# Patient Record
Sex: Female | Born: 1990 | Race: White | Hispanic: No | Marital: Married | Smoking: Never smoker
Health system: Southern US, Community
[De-identification: ages and names within clinical notes are randomized; demographics above are authoritative.]

## PROBLEM LIST (undated history)

## (undated) DIAGNOSIS — Z789 Other specified health status: Secondary | ICD-10-CM

## (undated) DIAGNOSIS — O24419 Gestational diabetes mellitus in pregnancy, unspecified control: Secondary | ICD-10-CM

## (undated) HISTORY — DX: Gestational diabetes mellitus in pregnancy, unspecified control: O24.419

---

## 2012-12-29 HISTORY — PX: COLONOSCOPY: SHX174

## 2016-06-27 ENCOUNTER — Other Ambulatory Visit: Payer: Self-pay | Admitting: Obstetrics and Gynecology

## 2016-06-27 DIAGNOSIS — Z369 Encounter for antenatal screening, unspecified: Secondary | ICD-10-CM

## 2016-07-17 ENCOUNTER — Ambulatory Visit
Admission: RE | Admit: 2016-07-17 | Discharge: 2016-07-17 | Disposition: A | Discharge status: Home / Self Care | Payer: PRIVATE HEALTH INSURANCE | Source: Ambulatory Visit | Attending: Maternal & Fetal Medicine | Admitting: Maternal & Fetal Medicine

## 2016-07-17 DIAGNOSIS — O321XX0 Maternal care for breech presentation, not applicable or unspecified: Secondary | ICD-10-CM | POA: Diagnosis not present

## 2016-07-17 DIAGNOSIS — Z3481 Encounter for supervision of other normal pregnancy, first trimester: Secondary | ICD-10-CM | POA: Insufficient documentation

## 2016-07-17 DIAGNOSIS — Z3A12 12 weeks gestation of pregnancy: Secondary | ICD-10-CM | POA: Insufficient documentation

## 2016-07-17 DIAGNOSIS — Z369 Encounter for antenatal screening, unspecified: Secondary | ICD-10-CM

## 2016-07-17 HISTORY — DX: Other specified health status: Z78.9

## 2016-11-14 ENCOUNTER — Encounter: Payer: Self-pay | Admitting: *Deleted

## 2016-11-14 ENCOUNTER — Encounter: Payer: PRIVATE HEALTH INSURANCE | Attending: Obstetrics and Gynecology | Admitting: *Deleted

## 2016-11-14 VITALS — BP 92/60 | Ht 64.0 in | Wt 164.2 lb

## 2016-11-14 DIAGNOSIS — Z3A00 Weeks of gestation of pregnancy not specified: Secondary | ICD-10-CM | POA: Diagnosis not present

## 2016-11-14 DIAGNOSIS — Z713 Dietary counseling and surveillance: Secondary | ICD-10-CM | POA: Insufficient documentation

## 2016-11-14 DIAGNOSIS — O24410 Gestational diabetes mellitus in pregnancy, diet controlled: Secondary | ICD-10-CM | POA: Diagnosis present

## 2016-11-14 NOTE — Progress Notes (Signed)
Diabetes Self-Management Education  Visit Type: First/Initial  Appt. Start Time: 0855 Appt. End Time: 1025  11/14/2016  Ms. Lucky RathkeKelli Facer, identified by name and date of birth, is a 25 y.o. female with a diagnosis of Diabetes: Gestational Diabetes.   ASSESSMENT  Blood pressure 92/60, height 5\' 4"  (1.626 m), weight 164 lb 3.2 oz (74.5 kg), last menstrual period 04/19/2016. Body mass index is 28.18 kg/m.      Diabetes Self-Management Education - 11/14/16 1106      Visit Information   Visit Type First/Initial     Initial Visit   Diabetes Type Gestational Diabetes   Are you currently following a meal plan? Yes   What type of meal plan do you follow? more chicken and green beans   Are you taking your medications as prescribed? Yes  needs to pick up iron from pharmacy   Date Diagnosed last week - 10/2016     Health Coping   How would you rate your overall health? Good     Psychosocial Assessment   Patient Belief/Attitude about Diabetes Motivated to manage diabetes   Self-care barriers None   Self-management support Doctor's office;Family   Patient Concerns Nutrition/Meal planning;Healthy Lifestyle   Special Needs None   Preferred Learning Style Auditory;Hands on   Learning Readiness Change in progress   How often do you need to have someone help you when you read instructions, pamphlets, or other written materials from your doctor or pharmacy? 1 - Never   What is the last grade level you completed in school?  DA degree     Pre-Education Assessment   Patient understands the diabetes disease and treatment process. Needs Instruction   Patient understands incorporating nutritional management into lifestyle. Needs Instruction   Patient undertands incorporating physical activity into lifestyle. Needs Instruction   Patient understands using medications safely. Needs Instruction   Patient understands monitoring blood glucose, interpreting and using results Needs Instruction   Patient understands prevention, detection, and treatment of acute complications. Needs Instruction   Patient understands prevention, detection, and treatment of chronic complications. Needs Instruction   Patient understands how to develop strategies to address psychosocial issues. Needs Instruction   Patient understands how to develop strategies to promote health/change behavior. Needs Instruction     Complications   How often do you check your blood sugar? 0 times/day (not testing)   Provided One Touch Verio Flex meter and instructed on use. BG upon return demonstration was 82 mg/dL at 96:0410:15 am - 2 1/2 hrs pp.    Have you had a dilated eye exam in the past 12 months? No   Have you had a dental exam in the past 12 months? Yes   Are you checking your feet? No     Dietary Intake   Breakfast peanut butter toast and little bit of honey   Snack (morning) [place] yogurt with some granola   Lunch chicken and green beans   Snack (afternoon) cheese stick   Dinner chicken and green beans   Beverage(s) water, 1 cup of coffee per day     Exercise   Exercise Type Light (walking / raking leaves)   How many days per week to you exercise? 5   How many minutes per day do you exercise? 30   Total minutes per week of exercise 150     Patient Education   Previous Diabetes Education No   Disease state  Definition of diabetes, type 1 and 2, and the diagnosis of diabetes  Nutrition management  Role of diet in the treatment of diabetes and the relationship between the three main macronutrients and blood glucose level;Food label reading, portion sizes and measuring food.   Physical activity and exercise  Role of exercise on diabetes management, blood pressure control and cardiac health.   Monitoring Taught/evaluated SMBG meter.;Purpose and frequency of SMBG.;Taught/discussed recording of test results and interpretation of SMBG.;Ketone testing, when, how.   Chronic complications Relationship between chronic  complications and blood glucose control   Psychosocial adjustment Identified and addressed patients feelings and concerns about diabetes   Preconception care Pregnancy and GDM  Role of pre-pregnancy blood glucose control on the development of the fetus;Reviewed with patient blood glucose goals with pregnancy;Role of family planning for patients with diabetes     Individualized Goals (developed by patient)   Reducing Risk Lead a healthier lifestyle     Outcomes   Expected Outcomes Demonstrated interest in learning. Expect positive outcomes   Future DMSE 2 wks      Individualized Plan for Diabetes Self-Management Training:   Learning Objective:  Patient will have a greater understanding of diabetes self-management. Patient education plan is to attend individual and/or group sessions per assessed needs and concerns.   Plan:   Patient Instructions  Read booklet on Gestational Diabetes Follow Gestational Meal Planning Guidelines Complete a 3 Day Food Record and bring to next appointment Check blood sugars 4 x day - before breakfast and 2 hrs after every meal and record  Bring blood sugar log to all appointments Call MD for prescription for meter strips and lancets Strips One Touch Verio  Lancets   One Touch Delica Purchase urine ketone strips if blood sugars not controlled and check urine ketones every am:  If + increase bedtime snack to 1 protein and 2 carbohydrate servings Walk 20-30 minutes at least 5 x week if permitted by MD   Expected Outcomes:  Demonstrated interest in learning. Expect positive outcomes  Education material provided:  Gestational Booklet Gestational Meal Planning Guidelines Viewed Gestational Diabetes Video Meter - One Touch Verio Flex 3 Day Food Record Goals for a Healthy Pregnancy  If problems or questions, patient to contact team via:  Sharion SettlerSheila Shakeitha Umbaugh, RN, CCM, CDE [phone]  Future DSME appointment: 2 wks  Friday November 28, 2016 at 10:00 am  with dietitian

## 2016-11-14 NOTE — Patient Instructions (Signed)
Read booklet on Gestational Diabetes Follow Gestational Meal Planning Guidelines Complete a 3 Day Food Record and bring to next appointment Check blood sugars 4 x day - before breakfast and 2 hrs after every meal and record  Bring blood sugar log to all appointments Call MD for prescription for meter strips and lancets Strips  One Touch Verio Lancets   One Touch Delica Purchase urine ketone strips if blood sugars not controlled and check urine ketones every am:  If + increase bedtime snack to 1 protein and 2 carbohydrate servings Walk 20-30 minutes at least 5 x week if permitted by MD  

## 2016-11-28 ENCOUNTER — Encounter: Payer: PRIVATE HEALTH INSURANCE | Attending: Obstetrics and Gynecology | Admitting: Dietician

## 2016-11-28 ENCOUNTER — Encounter: Payer: Self-pay | Admitting: Dietician

## 2016-11-28 VITALS — BP 110/70 | Ht 64.0 in | Wt 162.5 lb

## 2016-11-28 DIAGNOSIS — Z713 Dietary counseling and surveillance: Secondary | ICD-10-CM | POA: Diagnosis present

## 2016-11-28 DIAGNOSIS — O24419 Gestational diabetes mellitus in pregnancy, unspecified control: Secondary | ICD-10-CM | POA: Diagnosis present

## 2016-11-28 DIAGNOSIS — O24410 Gestational diabetes mellitus in pregnancy, diet controlled: Secondary | ICD-10-CM

## 2016-11-28 NOTE — Progress Notes (Signed)
   Patient's BG record indicates FBGs are within goal ranging 67-91; post-meal BGs are ranging from 72-116 + 3 readings above 120: 127, 129, 136 (two were after eating pasta).   Patient's food diary indicates: some meals are low in carbohydrate, all meals do have a protein source, good intake of vegetables.    Provided 2000kcal meal plan, and wrote individualized menus based on patient's food preferences.  Instructed patient on food safety, including avoidance of Listeriosis, and limiting mercury from fish.  Discussed importance of maintaining healthy lifestyle habits to reduce risk of Type 2 DM as well as Gestational DM with any future pregnancies.  Advised patient to use any remaining testing supplies to test some BGs after delivery, and to have BG tested ideally annually, as well as prior to attempting future pregnancies.

## 2016-11-28 NOTE — Patient Instructions (Signed)
   Add healthy carb choices to meals to equal 3 servings per meal.

## 2016-12-29 NOTE — L&D Delivery Note (Signed)
VAGINAL DELIVERY NOTE:  Date of Delivery: 01/23/2017 [facility] Gestational Age/EDD: 6362w6d 01/24/2017, by Last Menstrual Period Antepartum complications: None Attending Physician: Dr Ted McalpineWard/CJones, CNM Delivery Type:NSVD of viable female infant with nuchal hand Anesthesia: epidural for repair Laceration: 2nd degree with no 3rd degree, rectum intact after exam. Tore all the way down to the rectal tissue. Repaired with 3-0 CH on CT.  Episiotomy:none Placenta: SDOP intact, At 0602 Intrapartum complications: Early decels to 60's with pushing for a few contractions with the head on the perineum Estimated Blood Loss: 250  mls GBS: neg Procedure Details: CTSP as pt was pushing. Vtx with nuchal hand, Ant and post shoulder and body del at [phone]  with no CAN. Del to mom's abd. Delayed cord clamping. CCx2 and cut per dad. 3 VC noted. SDOP intact at [phone]. FF and lochia mod with no clots. EBL:250   mls   Laceration:  2nd degree extended to the rectum but, the anal sphincter was intact.      Baby: Liveborn Boy "[name]", Apgars 8-9, weight  #, oz,    Rolan Buccoaron w. Yetta BarreJones, RN, MSN, CNM, FNP

## 2017-01-22 ENCOUNTER — Inpatient Hospital Stay: Payer: PRIVATE HEALTH INSURANCE | Admitting: Anesthesiology

## 2017-01-22 ENCOUNTER — Inpatient Hospital Stay
Admission: EM | Admit: 2017-01-22 | Discharge: 2017-01-24 | DRG: 775 | Disposition: A | Discharge status: Home / Self Care | Payer: PRIVATE HEALTH INSURANCE | Attending: Obstetrics and Gynecology | Admitting: Obstetrics and Gynecology

## 2017-01-22 DIAGNOSIS — Z3A39 39 weeks gestation of pregnancy: Secondary | ICD-10-CM | POA: Diagnosis not present

## 2017-01-22 DIAGNOSIS — O24420 Gestational diabetes mellitus in childbirth, diet controlled: Secondary | ICD-10-CM | POA: Diagnosis present

## 2017-01-22 DIAGNOSIS — Z833 Family history of diabetes mellitus: Secondary | ICD-10-CM | POA: Diagnosis not present

## 2017-01-22 DIAGNOSIS — O4202 Full-term premature rupture of membranes, onset of labor within 24 hours of rupture: Secondary | ICD-10-CM | POA: Diagnosis present

## 2017-01-22 LAB — CBC
HEMATOCRIT: 37.8 % (ref 35.0–47.0)
Hemoglobin: 12.8 g/dL (ref 12.0–16.0)
MCH: 30.6 pg (ref 26.0–34.0)
MCHC: 33.8 g/dL (ref 32.0–36.0)
MCV: 90.7 fL (ref 80.0–100.0)
Platelets: 209 10*3/uL (ref 150–440)
RBC: 4.17 MIL/uL (ref 3.80–5.20)
RDW: 13.8 % (ref 11.5–14.5)
WBC: 10.2 10*3/uL (ref 3.6–11.0)

## 2017-01-22 LAB — RAPID HIV SCREEN (HIV 1/2 AB+AG)
HIV 1/2 ANTIBODIES: NONREACTIVE
HIV-1 P24 Antigen - HIV24: NONREACTIVE

## 2017-01-22 LAB — TYPE AND SCREEN
ABO/RH(D): A POS
ANTIBODY SCREEN: NEGATIVE

## 2017-01-22 LAB — GLUCOSE, CAPILLARY: Glucose-Capillary: 80 mg/dL (ref 65–99)

## 2017-01-22 MED ORDER — LIDOCAINE HCL (PF) 1 % IJ SOLN
30.0000 mL | INTRAMUSCULAR | Status: AC | PRN
  Administered 2017-01-22: 3 mL via SUBCUTANEOUS

## 2017-01-22 MED ORDER — OXYTOCIN BOLUS FROM INFUSION
500.0000 mL | Freq: Once | INTRAVENOUS | Status: AC
  Administered 2017-01-23: 500 mL via INTRAVENOUS

## 2017-01-22 MED ORDER — OXYTOCIN 40 UNITS IN LACTATED RINGERS INFUSION - SIMPLE MED
2.5000 [IU]/h | INTRAVENOUS | Status: DC
  Filled 2017-01-22: qty 1000

## 2017-01-22 MED ORDER — EPHEDRINE 5 MG/ML INJ
10.0000 mg | INTRAVENOUS | Status: DC | PRN
Start: 2017-01-22 — End: 2017-01-24
  Filled 2017-01-22: qty 2

## 2017-01-22 MED ORDER — EPHEDRINE 5 MG/ML INJ
10.0000 mg | INTRAVENOUS | Status: DC | PRN
  Filled 2017-01-22: qty 2

## 2017-01-22 MED ORDER — LACTATED RINGERS IV SOLN: 500.0000 mL | INTRAVENOUS | Status: DC | PRN

## 2017-01-22 MED ORDER — SOD CITRATE-CITRIC ACID 500-334 MG/5ML PO SOLN: 30.0000 mL | ORAL | Status: DC | PRN

## 2017-01-22 MED ORDER — ACETAMINOPHEN 325 MG PO TABS: 650.0000 mg | ORAL_TABLET | ORAL | Status: DC | PRN

## 2017-01-22 MED ORDER — LACTATED RINGERS IV SOLN: 500.0000 mL | Freq: Once | INTRAVENOUS | Status: DC

## 2017-01-22 MED ORDER — BUTORPHANOL TARTRATE 1 MG/ML IJ SOLN: 1.0000 mg | INTRAMUSCULAR | Status: DC | PRN

## 2017-01-22 MED ORDER — FENTANYL 2.5 MCG/ML W/ROPIVACAINE 0.2% IN NS 100 ML EPIDURAL INFUSION (ARMC-ANES)
EPIDURAL | Status: DC | PRN
  Administered 2017-01-22: 10 mL/h via EPIDURAL

## 2017-01-22 MED ORDER — LACTATED RINGERS IV SOLN: INTRAVENOUS | Status: DC

## 2017-01-22 MED ORDER — LIDOCAINE-EPINEPHRINE (PF) 1.5 %-1:200000 IJ SOLN
INTRAMUSCULAR | Status: DC | PRN
  Administered 2017-01-22: 4 mL via EPIDURAL

## 2017-01-22 MED ORDER — ONDANSETRON HCL 4 MG/2ML IJ SOLN: 4.0000 mg | Freq: Four times a day (QID) | INTRAMUSCULAR | Status: DC | PRN

## 2017-01-22 MED ORDER — AMMONIA AROMATIC IN INHA
RESPIRATORY_TRACT | Status: AC
  Filled 2017-01-22: qty 10

## 2017-01-22 MED ORDER — OXYTOCIN 10 UNIT/ML IJ SOLN
INTRAMUSCULAR | Status: AC
  Filled 2017-01-22: qty 2

## 2017-01-22 MED ORDER — FENTANYL 2.5 MCG/ML W/ROPIVACAINE 0.2% IN NS 100 ML EPIDURAL INFUSION (ARMC-ANES)
10.0000 mL/h | EPIDURAL | Status: AC
  Administered 2017-01-23: 10 mL/h via EPIDURAL
  Filled 2017-01-22: qty 100

## 2017-01-22 MED ORDER — FENTANYL 2.5 MCG/ML W/ROPIVACAINE 0.2% IN NS 100 ML EPIDURAL INFUSION (ARMC-ANES)
EPIDURAL | Status: AC
  Filled 2017-01-22: qty 100

## 2017-01-22 MED ORDER — PHENYLEPHRINE 40 MCG/ML (10ML) SYRINGE FOR IV PUSH (FOR BLOOD PRESSURE SUPPORT)
80.0000 ug | PREFILLED_SYRINGE | INTRAVENOUS | Status: DC | PRN
  Filled 2017-01-22: qty 5

## 2017-01-22 MED ORDER — DIPHENHYDRAMINE HCL 50 MG/ML IJ SOLN: 12.5000 mg | INTRAMUSCULAR | Status: DC | PRN

## 2017-01-22 MED ORDER — LIDOCAINE HCL (PF) 1 % IJ SOLN
INTRAMUSCULAR | Status: AC
  Filled 2017-01-22: qty 30

## 2017-01-22 MED ORDER — BUPIVACAINE HCL (PF) 0.25 % IJ SOLN
INTRAMUSCULAR | Status: DC | PRN
  Administered 2017-01-22: 5 mL via EPIDURAL

## 2017-01-22 NOTE — Progress Notes (Signed)
Samantha Contreras is a [age] you feLucky Rathkemale presenting here this pm for "SROM at 1715 for clear fluid". Pt is now comfortable after her epidural and smiling.   Subjective: Feels much improved after epidural  Objective: BP 111/69   Pulse 75   Temp 97.9 F (36.6 C) (Oral)   Resp 18   Ht 5\' 4"  (1.626 m)   Wt 80.7 kg (178 lb)   LMP 04/19/2016   BMI 30.55 kg/m   FHT: Cat 1, + accels, no decels UC:   q2-3 mins, mod to palp SVE:   Dilation: 3.5 Effacement (%): 90 Station: -1 Exam by:: McSween, RN  Labs: Lab Results  Component Value Date   WBC 10.2 01/22/2017   HGB 12.8 01/22/2017   HCT 37.8 01/22/2017   MCV 90.7 01/22/2017   PLT 209 01/22/2017  Foley to BSD,LR at 1925ml/h  Assessment / Plan: A: IUP at Term 2.GBS neg  P: Continue to labor with epidural 2. Antic SVD.  Labor: progressing Preeclampsia: none Fetal Wellbeing: Cat 1 Pain Control:  epidural  Anticipated MOD:  NSVD  [name] 01/22/2017, 10:11 PM

## 2017-01-22 NOTE — Anesthesia Procedure Notes (Signed)
Epidural Patient location during procedure: OB  Staffing Performed: anesthesiologist   Preanesthetic Checklist Completed: patient identified, site marked, surgical consent, pre-op evaluation, timeout performed, IV checked, risks and benefits discussed and monitors and equipment checked  Epidural Patient position: sitting Prep: Betadine Patient monitoring: heart rate, continuous pulse ox and blood pressure Approach: midline Location: L4-L5 Injection technique: LOR saline  Needle:  Needle type: Tuohy  Needle gauge: 17 G Needle length: 9 cm and 9 Needle insertion depth: 5 cm Catheter type: closed end flexible Catheter size: 19 Gauge Test dose: negative and 1.5% lidocaine with Epi 1:200 K  Assessment Sensory level: T10 Events: blood not aspirated, injection not painful, no injection resistance, negative IV test and no paresthesia  Additional Notes   Patient tolerated the insertion well without complications.-SATD -IVTD. No paresthesia. [facility] for VS and dosingReason for block:procedure for pain

## 2017-01-22 NOTE — Anesthesia Preprocedure Evaluation (Signed)
Anesthesia Evaluation  Patient identified by MRN, date of birth, ID band Patient awake    Reviewed: Allergy & Precautions, H&P , NPO status , Patient's Chart, lab work & pertinent test results, reviewed documented beta blocker date and time   Airway Mallampati: II  TM Distance: >3 FB Neck ROM: full    Dental no notable dental hx. (+) Teeth Intact   Pulmonary neg pulmonary ROS, Current Smoker,    Pulmonary exam normal breath sounds clear to auscultation       Cardiovascular Exercise Tolerance: Good negative cardio ROS   Rhythm:regular Rate:Normal     Neuro/Psych negative neurological ROS  negative psych ROS   GI/Hepatic negative GI ROS, Neg liver ROS,   Endo/Other  negative endocrine ROSdiabetes  Renal/GU      Musculoskeletal   Abdominal   Peds  Hematology negative hematology ROS (+)   Anesthesia Other Findings   Reproductive/Obstetrics (+) Pregnancy                             Anesthesia Physical Anesthesia Plan  ASA: II  Anesthesia Plan: Epidural   Post-op Pain Management:    Induction:   Airway Management Planned:   Additional Equipment:   Intra-op Plan:   Post-operative Plan:   Informed Consent: I have reviewed the patients History and Physical, chart, labs and discussed the procedure including the risks, benefits and alternatives for the proposed anesthesia with the patient or authorized representative who has indicated his/her understanding and acceptance.     Plan Discussed with:   Anesthesia Plan Comments:         Anesthesia Quick Evaluation  

## 2017-01-22 NOTE — H&P (Signed)
Samantha Contreras is a 26 y.o. female presenting for "SROM today after membranes stripped in the office". [facility] with LMP of 04/19/16 & EDD of 01/24/17. US  Pt has diet controlled diabetes and her sugars have been normal in the pregnancy. [place] and her testing was neg. Declined NT testing.  OB History    Gravida Para Term Preterm AB Living   1             SAB TAB Ectopic Multiple Live Births                 Past Medical History:  Diagnosis Date  . Gestational diabetes   . Medical history non-contributory    Past Surgical History:  Procedure Laterality Date  . COLONOSCOPY  2014   Family History: family history includes Diabetes in her father and maternal grandfather. Social History:  reports that she has never smoked. She has never used smokeless tobacco. She reports that she does not drink alcohol or use drugs.     Maternal Diabetes: Gest DM Genetic Screening: declined NT. Maternal Ultrasounds/Referrals:  Fetal Ultrasounds or other Referrals:   Maternal Substance Abuse: None Significant Maternal Medications: PNV Significant Maternal Lab Results:   Other Comments:  Diet controlled DM  Review of Systems  Constitutional: Negative.   HENT: Negative.   Eyes: Negative.   Respiratory: Negative.   Cardiovascular: Negative.   Gastrointestinal: Negative.   Musculoskeletal: Negative.   Skin: Negative.   Neurological: Negative.   Endo/Heme/Allergies: Negative.   Psychiatric/Behavioral: Negative.    History   Temperature 98.1 F (36.7 C), temperature source Oral, resp. rate 18, height 5\' 4"  (1.626 m), weight 80.7 kg (178 lb), last menstrual period 04/19/2016. Exam Physical Exam  Prenatal labs: ABO, Rh:  A pos Antibody:  neg Rubella:  immune RPR:   NR HBsAg:   neg HIV:   neg GBS:   Neg  Assessment/Plan: A: IUP at term 2. SROM after membranes stripped in office today    Samantha Contreras 01/22/2017, 6:26 PM

## 2017-01-23 DIAGNOSIS — O09299 Supervision of pregnancy with other poor reproductive or obstetric history, unspecified trimester: Secondary | ICD-10-CM

## 2017-01-23 MED ORDER — WITCH HAZEL-GLYCERIN EX PADS: 1.0000 "application " | MEDICATED_PAD | CUTANEOUS | Status: DC | PRN

## 2017-01-23 MED ORDER — SENNOSIDES-DOCUSATE SODIUM 8.6-50 MG PO TABS
2.0000 | ORAL_TABLET | ORAL | Status: DC
  Administered 2017-01-24: 2 via ORAL
  Filled 2017-01-23: qty 2

## 2017-01-23 MED ORDER — TETANUS-DIPHTH-ACELL PERTUSSIS 5-2.5-18.5 LF-MCG/0.5 IM SUSP: 0.5000 mL | Freq: Once | INTRAMUSCULAR | Status: DC

## 2017-01-23 MED ORDER — DIPHENHYDRAMINE HCL 25 MG PO CAPS: 25.0000 mg | ORAL_CAPSULE | Freq: Four times a day (QID) | ORAL | Status: DC | PRN

## 2017-01-23 MED ORDER — PRENATAL MULTIVITAMIN CH
1.0000 | ORAL_TABLET | Freq: Every day | ORAL | Status: DC
  Administered 2017-01-23: 1 via ORAL
  Filled 2017-01-23: qty 1

## 2017-01-23 MED ORDER — SODIUM CHLORIDE 0.9% FLUSH: 3.0000 mL | INTRAVENOUS | Status: DC | PRN

## 2017-01-23 MED ORDER — IBUPROFEN 600 MG PO TABS
600.0000 mg | ORAL_TABLET | Freq: Four times a day (QID) | ORAL | Status: DC
  Administered 2017-01-23 – 2017-01-24 (×4): 600 mg via ORAL
  Filled 2017-01-23 (×4): qty 1

## 2017-01-23 MED ORDER — FLEET ENEMA 7-19 GM/118ML RE ENEM: 1.0000 | ENEMA | Freq: Every day | RECTAL | Status: DC | PRN

## 2017-01-23 MED ORDER — COCONUT OIL OIL
1.0000 | TOPICAL_OIL | Status: DC | PRN
Start: 2017-01-23 — End: 2017-01-24

## 2017-01-23 MED ORDER — ONDANSETRON HCL 4 MG/2ML IJ SOLN: 4.0000 mg | INTRAMUSCULAR | Status: DC | PRN

## 2017-01-23 MED ORDER — ONDANSETRON HCL 4 MG PO TABS: 4.0000 mg | ORAL_TABLET | ORAL | Status: DC | PRN

## 2017-01-23 MED ORDER — SODIUM CHLORIDE 0.9% FLUSH: 3.0000 mL | Freq: Two times a day (BID) | INTRAVENOUS | Status: DC

## 2017-01-23 MED ORDER — MEASLES, MUMPS & RUBELLA VAC ~~LOC~~ INJ
0.5000 mL | INJECTION | Freq: Once | SUBCUTANEOUS | Status: DC
  Filled 2017-01-23: qty 0.5

## 2017-01-23 MED ORDER — SIMETHICONE 80 MG PO CHEW: 80.0000 mg | CHEWABLE_TABLET | ORAL | Status: DC | PRN

## 2017-01-23 MED ORDER — ACETAMINOPHEN 325 MG PO TABS
650.0000 mg | ORAL_TABLET | ORAL | Status: DC | PRN
Start: 2017-01-23 — End: 2017-01-24

## 2017-01-23 MED ORDER — BENZOCAINE-MENTHOL 20-0.5 % EX AERO
1.0000 "application " | INHALATION_SPRAY | CUTANEOUS | Status: DC | PRN
  Filled 2017-01-23: qty 56

## 2017-01-23 MED ORDER — DIBUCAINE 1 % RE OINT: 1.0000 "application " | TOPICAL_OINTMENT | RECTAL | Status: DC | PRN

## 2017-01-23 MED ORDER — BISACODYL 10 MG RE SUPP: 10.0000 mg | Freq: Every day | RECTAL | Status: DC | PRN

## 2017-01-23 MED ORDER — SODIUM CHLORIDE 0.9 % IV SOLN: 250.0000 mL | INTRAVENOUS | Status: DC | PRN

## 2017-01-23 MED ORDER — ZOLPIDEM TARTRATE 5 MG PO TABS: 5.0000 mg | ORAL_TABLET | Freq: Every evening | ORAL | Status: DC | PRN

## 2017-01-23 NOTE — Plan of Care (Signed)
Pt up to v oid. Pt voided QS. Pericare completed well per pt.  [facility] in stable condition.  Ellison Carwin Jacobi Nile RNC

## 2017-01-23 NOTE — Lactation Note (Signed)
This note was copied from a baby's chart. Lactation Consultation Note  Patient Name: Samantha Lucky RathkeKelli Nicholl ZOXWR'UToday's Date: 01/23/2017 Reason for consult: Initial assessment;Difficult latch Mom c/o unable to latch baby to right breast despite pumping first. The areola does seem more edematous now than earlier. Reverse pressure and then applied 24 mm nipple shield. He almost immediately latched correctly and developed nutritive suckling with audible swallows. Some breast compression/massage helps him get more milk flow and feed better.   Maternal Data    Feeding Feeding Type: Breast Fed Length of feed: 25 min  LATCH Score/Interventions Latch: Grasps breast easily, tongue down, lips flanged, rhythmical sucking. (only with nipple shield on right breast. No NS needed on lef) Intervention(s): Adjust position;Assist with latch;Breast massage;Breast compression  Audible Swallowing: A few with stimulation Intervention(s): Hand expression  Type of Nipple: Flat (right more flat than left) Intervention(s): Reverse pressure;Shells;Hand pump  Comfort (Breast/Nipple): Soft / non-tender     Hold (Positioning): Assistance needed to correctly position infant at breast and maintain latch. Intervention(s): Breastfeeding basics reviewed;Support Pillows;Position options  LATCH Score: 7  Lactation Tools Discussed/Used Tools: Shells;Nipple Dorris CarnesShields;Pump Nipple shield size: 24 Shell Type: Inverted Breast pump type: Manual (to evert nipple prior to latch prn)   Consult Status Consult Status: Follow-up Date: 01/24/17 Follow-up type: In-patient    Sunday CornSandra Clark Conda Wannamaker 01/23/2017, 5:04 PM

## 2017-01-23 NOTE — Discharge Summary (Addendum)
Obstetric Discharge Summary Reason for Admission: onset of labor Prenatal Procedures: ultrasound Intrapartum Procedures: Epidural, AROM of forebag with lt mec Postpartum Procedures: Breastfeeding and lactation consult Complications-Operative and Postpartum: None Hemoglobin  Date Value Ref Range Status  01/24/2017 10.2 (L) 12.0 - 16.0 g/dL Final   HCT  Date Value Ref Range Status  01/24/2017 29.5 (L) 35.0 - 47.0 % Final    Physical Exam:  General: A,A& O x 3 Lochia: mod, no clots Uterine Fundus: U-1, FF DVT Evaluation: neg Homans  Discharge Diagnoses: Term pregnancy with NSVD with 2nd degree lac  Discharge Information: Date: 01/24/2017 Activity:Up ad lib, no driving x 6 weeks, no sex x 6 weeks Diet: Regular Medications: PNV Condition: Stable  Instructions: [facility] Discharge to: home   @[email or web address] (Error - No data available.)@ Allergies as of 01/24/2017   No Known Allergies     Medication List    STOP taking these medications   DOCOSAHEXAENOIC ACID PO     TAKE these medications   ascorbic acid 250 MG tablet Commonly known as:  VITAMIN C Take 1 tablet (250 mg total) by mouth 2 (two) times daily with a meal. Take with iron for anemia   docusate sodium 100 MG capsule Commonly known as:  COLACE Take 1 capsule (100 mg total) by mouth daily as needed for mild constipation.   ibuprofen 800 MG tablet Commonly known as:  ADVIL,MOTRIN Take 1 tablet (800 mg total) by mouth every 8 (eight) hours as needed for moderate pain or cramping.   IRON PO Take 1 tablet by mouth daily.   multivitamin-prenatal 27-0.8 MG Tabs tablet Take 1 tablet by mouth daily at 12 noon.       Newborn Data: Live born female named "[name]" Birth Weight:   APGAR: 8-9,   Home with mom  Christeen DouglasBEASLEY, Briceson Broadwater 01/24/2017, 11:04 AM

## 2017-01-23 NOTE — Progress Notes (Addendum)
Samantha RathkeKelli Contreras is a 26 y.o. G1P0 at 4978w6d with Cx at C/C/vtx+1 pushing with excellent progress.   Subjective: "Feels rectal pressure"  Objective: BP 119/73   Pulse 96   Temp 99.6 F (37.6 C) (Axillary)   Resp 18   Ht 5\' 4"  (1.626 m)   Wt 80.7 kg (178 lb)   LMP 04/19/2016   BMI 30.55 kg/m  No intake/output data recorded. Total I/O In: -  Out: 600 [Urine:600]  FHT: 140,early decel with pushing, Cat 2 UC:  q 1.5 mins SVE:   Dilation: 10 Effacement (%): 100 Station: +2 Exam by:: McSween, RN  Labs: Lab Results  Component Value Date   WBC 10.2 01/22/2017   HGB 12.8 01/22/2017   HCT 37.8 01/22/2017   MCV 90.7 01/22/2017   PLT 209 01/22/2017    Assessment / Plan: A: 1. IUP at term 2. GBS neg P: Enc pt to push 2. Antic SVD.   Sharee Pimplearon W Audel Coakley 01/23/2017, 5:35 AM

## 2017-01-23 NOTE — Lactation Note (Signed)
This note was copied from a baby's chart. Lactation Consultation Note  Patient Name: Samantha Contreras: 01/23/2017  During my rounds, Mom says latching baby [name] is a little challenging but that he just nursed "well" for about 15 minutes. Sharp pain at first and then tugs on breast. She does have flat nipples that flatten a bit more with compression. I taught her reverse compression; hand expression; breast shells for flat nipples and a manual pump to evert nipple prior to latch (which did help). I reviewed basic teaching and informed her of [employer] for more BF support.   Maternal Data    Feeding Feeding Type: Breast Fed Length of feed: 15 min  LATCH Score/Interventions Latch: Repeated attempts needed to sustain latch, nipple held in mouth throughout feeding, stimulation needed to elicit sucking reflex. Intervention(s): Assist with latch;Adjust position  Audible Swallowing: A few with stimulation Intervention(s): Skin to skin;Hand expression  Type of Nipple: Flat  Comfort (Breast/Nipple): Soft / non-tender     Hold (Positioning): Assistance needed to correctly position infant at breast and maintain latch. Intervention(s): Support Pillows;Position options  LATCH Score: 6  Lactation Tools Discussed/Used     Consult Status      Sunday CornSandra Clark Raigan Baria 01/23/2017, 1:44 PM

## 2017-01-24 LAB — CBC
HCT: 29.5 % — ABNORMAL LOW (ref 35.0–47.0)
HEMOGLOBIN: 10.2 g/dL — AB (ref 12.0–16.0)
MCH: 31.3 pg (ref 26.0–34.0)
MCHC: 34.5 g/dL (ref 32.0–36.0)
MCV: 90.8 fL (ref 80.0–100.0)
PLATELETS: 171 10*3/uL (ref 150–440)
RBC: 3.24 MIL/uL — ABNORMAL LOW (ref 3.80–5.20)
RDW: 14 % (ref 11.5–14.5)
WBC: 12.9 10*3/uL — ABNORMAL HIGH (ref 3.6–11.0)

## 2017-01-24 LAB — RPR: RPR: NONREACTIVE

## 2017-01-24 MED ORDER — DOCUSATE SODIUM 100 MG PO CAPS: 100.0000 mg | ORAL_CAPSULE | Freq: Every day | ORAL | 3 refills | Status: DC | PRN

## 2017-01-24 MED ORDER — ASCORBIC ACID 250 MG PO TABS: 250.0000 mg | ORAL_TABLET | Freq: Two times a day (BID) | ORAL | 3 refills | Status: AC

## 2017-01-24 MED ORDER — IBUPROFEN 800 MG PO TABS: 800.0000 mg | ORAL_TABLET | Freq: Three times a day (TID) | ORAL | 0 refills | Status: AC | PRN

## 2017-01-24 NOTE — Progress Notes (Signed)
Pt discharged home with infant.  Discharge instructions and follow up appointment given to and reviewed with pt.  Pt verbalized understanding.  Escorted by staff. 

## 2017-01-24 NOTE — Anesthesia Postprocedure Evaluation (Signed)
Anesthesia Post Note  Patient: Samantha Contreras  Procedure(s) Performed: * No procedures listed *  Patient location during evaluation: Mother Baby Anesthesia Type: Epidural Level of consciousness: awake and alert and oriented Pain management: pain level controlled Vital Signs Assessment: post-procedure vital signs reviewed and stable Respiratory status: spontaneous breathing, nonlabored ventilation and respiratory function stable Cardiovascular status: stable Postop Assessment: no headache, no backache, epidural receding and no signs of nausea or vomiting (no pruritis) Anesthetic complications: no     Last Vitals:  Vitals:   01/24/17 0422 01/24/17 0742  BP: 118/70 98/70  Pulse: 91 83  Resp: 18 16  Temp: 36.6 C 36.3 C    Last Pain:  Vitals:   01/24/17 0835  TempSrc:   PainSc: 0-No pain                 Ilia Engelbert

## 2017-05-13 DIAGNOSIS — Z79899 Other long term (current) drug therapy: Secondary | ICD-10-CM | POA: Insufficient documentation

## 2017-05-13 DIAGNOSIS — R1011 Right upper quadrant pain: Secondary | ICD-10-CM | POA: Diagnosis present

## 2017-05-13 DIAGNOSIS — R197 Diarrhea, unspecified: Secondary | ICD-10-CM | POA: Diagnosis not present

## 2017-05-13 LAB — COMPREHENSIVE METABOLIC PANEL
ALK PHOS: 52 U/L (ref 38–126)
ALT: 103 U/L — AB (ref 14–54)
ANION GAP: 7 (ref 5–15)
AST: 77 U/L — ABNORMAL HIGH (ref 15–41)
Albumin: 4.4 g/dL (ref 3.5–5.0)
BUN: 12 mg/dL (ref 6–20)
CALCIUM: 8.9 mg/dL (ref 8.9–10.3)
CO2: 26 mmol/L (ref 22–32)
CREATININE: 0.83 mg/dL (ref 0.44–1.00)
Chloride: 106 mmol/L (ref 101–111)
Glucose, Bld: 99 mg/dL (ref 65–99)
Potassium: 3.3 mmol/L — ABNORMAL LOW (ref 3.5–5.1)
SODIUM: 139 mmol/L (ref 135–145)
TOTAL PROTEIN: 7.3 g/dL (ref 6.5–8.1)
Total Bilirubin: 0.5 mg/dL (ref 0.3–1.2)

## 2017-05-13 LAB — CBC
HCT: 39.3 % (ref 35.0–47.0)
HEMOGLOBIN: 13.4 g/dL (ref 12.0–16.0)
MCH: 29.8 pg (ref 26.0–34.0)
MCHC: 34.1 g/dL (ref 32.0–36.0)
MCV: 87.5 fL (ref 80.0–100.0)
PLATELETS: 256 10*3/uL (ref 150–440)
RBC: 4.49 MIL/uL (ref 3.80–5.20)
RDW: 12.5 % (ref 11.5–14.5)
WBC: 5.3 10*3/uL (ref 3.6–11.0)

## 2017-05-13 LAB — URINALYSIS, COMPLETE (UACMP) WITH MICROSCOPIC
Bacteria, UA: NONE SEEN
Bilirubin Urine: NEGATIVE
GLUCOSE, UA: NEGATIVE mg/dL
Hgb urine dipstick: NEGATIVE
KETONES UR: NEGATIVE mg/dL
Leukocytes, UA: NEGATIVE
NITRITE: NEGATIVE
PH: 6 (ref 5.0–8.0)
Protein, ur: NEGATIVE mg/dL
RBC / HPF: NONE SEEN RBC/hpf (ref 0–5)
Specific Gravity, Urine: 1.009 (ref 1.005–1.030)

## 2017-05-13 LAB — LIPASE, BLOOD: Lipase: 18 U/L (ref 11–51)

## 2017-05-13 NOTE — ED Notes (Signed)
Pt given specimen cup to obtain urine when able to. 

## 2017-05-13 NOTE — ED Triage Notes (Signed)
Pt states RUQ and mid abdomen pain that started today after dinner. Pt states nausea and RUQ pain that radiates to back shoulder blade. Pt states she took 3 TUMs with no relief. Recent pregnancy and delivery

## 2017-05-14 ENCOUNTER — Emergency Department: Payer: PRIVATE HEALTH INSURANCE

## 2017-05-14 ENCOUNTER — Emergency Department
Admission: EM | Admit: 2017-05-14 | Discharge: 2017-05-14 | Disposition: A | Discharge status: Home / Self Care | Payer: PRIVATE HEALTH INSURANCE | Attending: Emergency Medicine | Admitting: Emergency Medicine

## 2017-05-14 DIAGNOSIS — R1011 Right upper quadrant pain: Secondary | ICD-10-CM

## 2017-05-14 DIAGNOSIS — R197 Diarrhea, unspecified: Secondary | ICD-10-CM

## 2017-05-14 MED ORDER — SODIUM CHLORIDE 0.9 % IV BOLUS (SEPSIS)
500.0000 mL | Freq: Once | INTRAVENOUS | Status: AC
  Administered 2017-05-14: 500 mL via INTRAVENOUS

## 2017-05-14 MED ORDER — MORPHINE SULFATE (PF) 2 MG/ML IV SOLN
2.0000 mg | Freq: Once | INTRAVENOUS | Status: AC
  Administered 2017-05-14: 2 mg via INTRAVENOUS
  Filled 2017-05-14: qty 1

## 2017-05-14 MED ORDER — ONDANSETRON HCL 4 MG/2ML IJ SOLN
4.0000 mg | Freq: Once | INTRAMUSCULAR | Status: AC
  Administered 2017-05-14: 4 mg via INTRAVENOUS
  Filled 2017-05-14: qty 2

## 2017-05-14 NOTE — ED Provider Notes (Signed)
[facility] Emergency Department Provider Note   ____________________________________________   First MD Initiated Contact with Patient 05/14/17 0109     (approximate)  I have reviewed the triage vital signs and the nursing notes.   HISTORY  Chief Complaint Abdominal Pain    HPI Samantha Contreras is a 26 y.o. female who presents to the ED from home with a chief complaint of abdominal pain. Patient is 4 months postpartum, breast-feeding, who reports right upper quadrant abdominal pain which started yesterday evening approximately one hour after dinner.Describes sharp and aching type pain which radiates to her right shoulder blade and is associated with nausea only. Symptoms worsened after eating. Reports taking Tums without relief of symptoms. Also reports she has had several episodes of diarrhea for the past 3 days. Feels gassy. Denies associated fever, chills, chest pain, shortness of breath, vomiting, dysuria. Denies recent travel, camping, antibiotic use or trauma.   Past Medical History:  Diagnosis Date  . Gestational diabetes   . Medical history non-contributory     Patient Active Problem List   Diagnosis Date Noted  . Indication for care in labor and delivery, delivered 01/23/2017  . Indication for care in labor and delivery, antepartum 01/22/2017  . Indication for care in labor or delivery 01/22/2017    Past Surgical History:  Procedure Laterality Date  . COLONOSCOPY  2014    Prior to Admission medications   Medication Sig Start Date End Date Taking? Authorizing Provider  docusate sodium (COLACE) 100 MG capsule Take 1 capsule (100 mg total) by mouth daily as needed for mild constipation. 01/24/17   Christeen Douglas, MD  IRON PO Take 1 tablet by mouth daily.    [provider]  Prenatal Vit-Fe Fumarate-FA (MULTIVITAMIN-PRENATAL) 27-0.8 MG TABS tablet Take 1 tablet by mouth daily at 12 noon.    [provider]     Allergies Patient has no known allergies.  Family History  Problem Relation Age of Onset  . Diabetes Father   . Diabetes Maternal Grandfather     Social History Social History  Substance Use Topics  . Smoking status: Never Smoker  . Smokeless tobacco: Never Used  . Alcohol use No    Review of Systems  Constitutional: No fever/chills. Eyes: No visual changes. ENT: No sore throat. Cardiovascular: Denies chest pain. Respiratory: Denies shortness of breath. Gastrointestinal: Positive for abdominal pain and nausea, no vomiting.  No diarrhea.  No constipation. Genitourinary: Negative for dysuria. Musculoskeletal: Negative for back pain. Skin: Negative for rash. Neurological: Negative for headaches, focal weakness or numbness.   ____________________________________________   PHYSICAL EXAM:  VITAL SIGNS: ED Triage Vitals  Enc Vitals Group     BP 05/13/17 2159 116/68     Pulse Rate 05/13/17 2159 82     Resp 05/13/17 2159 18     Temp 05/13/17 2159 98.1 F (36.7 C)     Temp Source 05/13/17 2159 Oral     SpO2 05/13/17 2159 100 %     Weight 05/13/17 2200 145 lb (65.8 kg)     Height 05/13/17 2200 5\' 4"  (1.626 m)     Head Circumference --      Peak Flow --      Pain Score 05/13/17 2158 10     Pain Loc --      Pain Edu? --      Excl. in GC? --     Constitutional: Alert and oriented. Well appearing and in no acute distress. Eyes: Conjunctivae  are normal. PERRL. EOMI. Head: Atraumatic. Nose: No congestion/rhinnorhea. Mouth/Throat: Mucous membranes are moist.  Oropharynx non-erythematous. Neck: No stridor.   Cardiovascular: Normal rate, regular rhythm. Grossly normal heart sounds.  Good peripheral circulation. Respiratory: Normal respiratory effort.  No retractions. Lungs CTAB. Gastrointestinal: Soft and mildly tender to palpation right upper quadrant without rebound or guarding. No distention. No abdominal bruits. No CVA tenderness. Musculoskeletal: No lower  extremity tenderness nor edema.  No joint effusions. Neurologic:  Normal speech and language. No gross focal neurologic deficits are appreciated. No gait instability. Skin:  Skin is warm, dry and intact. No rash noted. Psychiatric: Mood and affect are normal. Speech and behavior are normal.  ____________________________________________   LABS (all labs ordered are listed, but only abnormal results are displayed)  Labs Reviewed  COMPREHENSIVE METABOLIC PANEL - Abnormal; Notable for the following:       Result Value   Potassium 3.3 (*)    AST 77 (*)    ALT 103 (*)    All other components within normal limits  URINALYSIS, COMPLETE (UACMP) WITH MICROSCOPIC - Abnormal; Notable for the following:    Color, Urine YELLOW (*)    APPearance HAZY (*)    Squamous Epithelial / LPF 0-5 (*)    All other components within normal limits  LIPASE, BLOOD  CBC   ____________________________________________  EKG  None ____________________________________________  RADIOLOGY  Abdominal ultrasound interpreted per Dr. Sterling BigKwon: Normal right upper quadrant ultrasound exam. ____________________________________________   PROCEDURES  Procedure(s) performed: None  Procedures  Critical Care performed: No  ____________________________________________   INITIAL IMPRESSION / ASSESSMENT AND PLAN / ED COURSE  Pertinent labs & imaging results that were available during my care of the patient were reviewed by me and considered in my medical decision making (see chart for details).  26 year old female who is 4 months postpartum who presents with right upper quadrant abdominal pain associated with nausea, worsened after eating. Laboratory and urinalysis results remarkable for mild elevation of transaminases. Patient will pump first prior to receiving analgesia; will proceed with abdominal ultrasound for suspicion of cholelithiasis/cholecystitis.  Clinical Course as of May 14 544  Thu May 14, 2017   [id number] Patient resting in no acute distress. Updated patient and mother of negative ultrasound results. Voices no pain currently. Repeated abdominal exam which is nontender to palpation. I did discuss with the patient and her mother utility of obtaining CT scan at this time. We discussed risks/benefits and patient would prefer to hold off at this time. Encouraged BRAT diet for diarrhea (which patient has not had while in the ED for over 6 hours) and close follow-up with her PCP. Strict return precautions given. Both verbalize understanding and agree with plan of care.  [JS]    Clinical Course User Index [JS] Irean HongSung, Debie Ashline J, MD     ____________________________________________   FINAL CLINICAL IMPRESSION(S) / ED DIAGNOSES  Final diagnoses:  Right upper quadrant abdominal pain  Diarrhea, unspecified type      NEW MEDICATIONS STARTED DURING THIS VISIT:  Discharge Medication List as of 05/14/2017  4:47 AM       Note:  This document was prepared using Dragon voice recognition software and may include unintentional dictation errors.    Irean HongSung, Symphony Demuro J, MD 05/14/17 [phone]

## 2017-05-14 NOTE — ED Notes (Addendum)
Pt given breast pump

## 2017-05-14 NOTE — Discharge Instructions (Signed)
1. Eat a BRAT diet for the next several days until your stools are firm. 2. Return to the ER for worsening symptoms, persistent vomiting, fever or other concerns.

## 2017-11-17 IMAGING — US US ABDOMEN LIMITED
1 series · 14 of 25 positions shown · non-contrast
Comparison: None.

CLINICAL DATA: Right upper quadrant pain x1 day

EXAM:
US ABDOMEN LIMITED - RIGHT UPPER QUADRANT

[Series 1: us abdomen limited · 0.19mm/px · 14 of 41 slices shown]
[im 1/41]
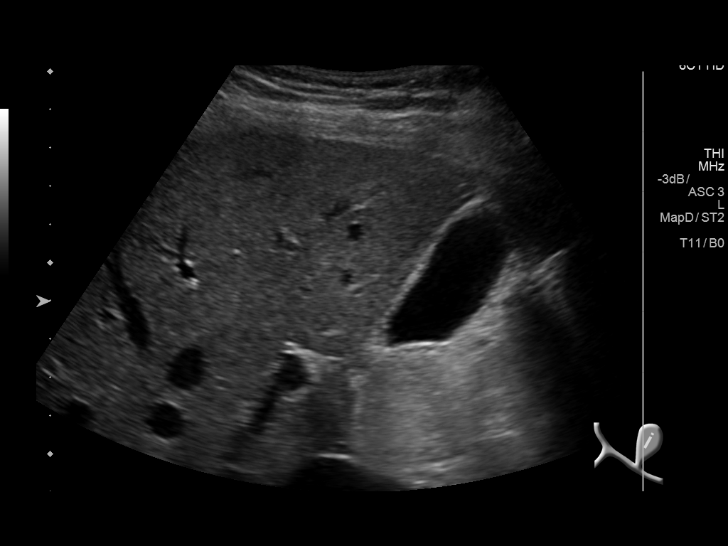
[im 4/41]
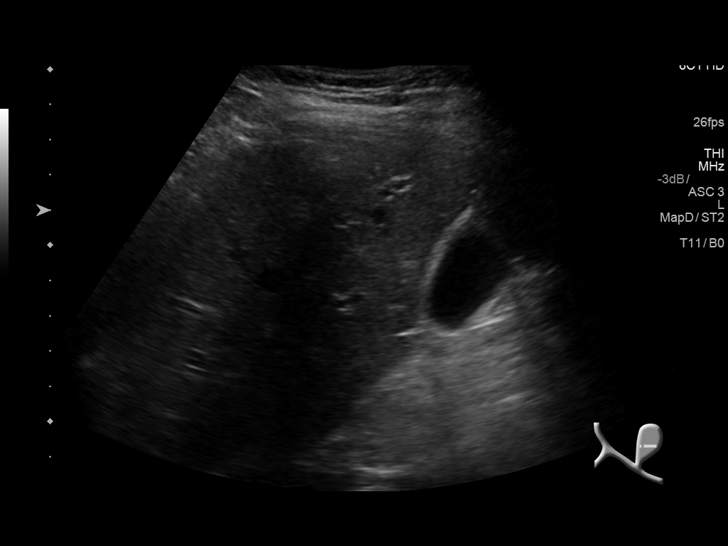
[im 7/41]
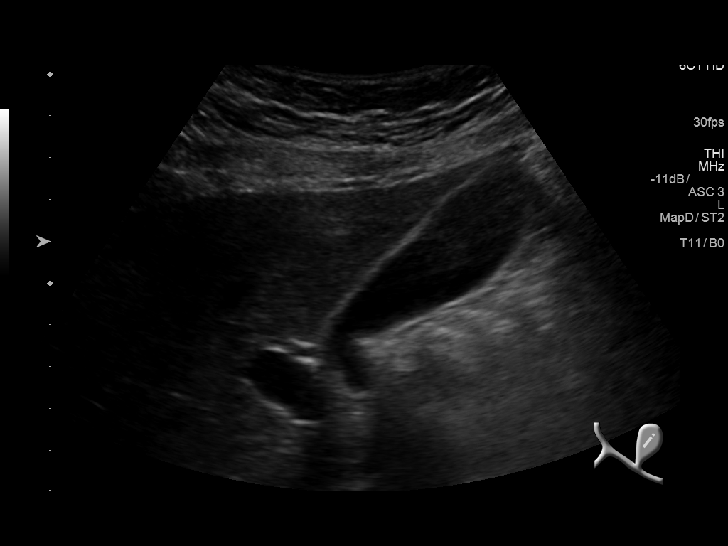
[im 11/41]
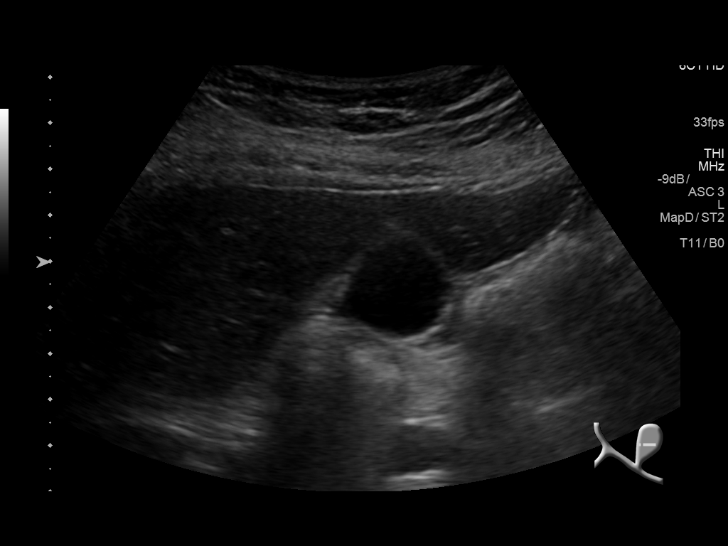
[im 14/41]
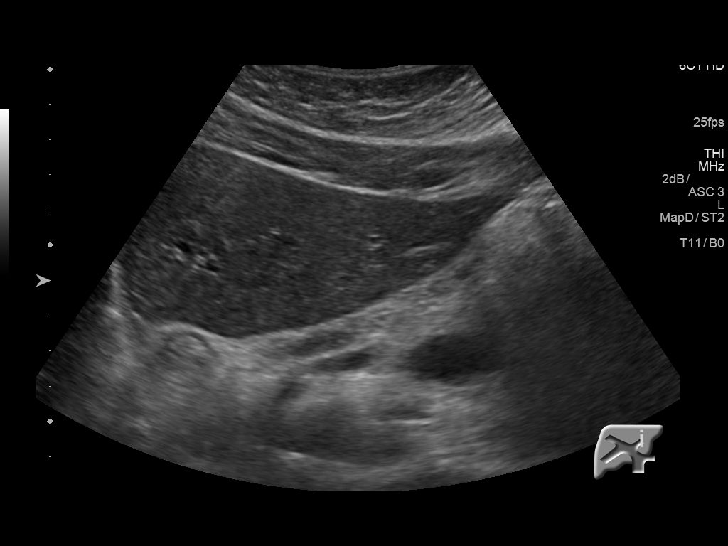
[im 16/41]
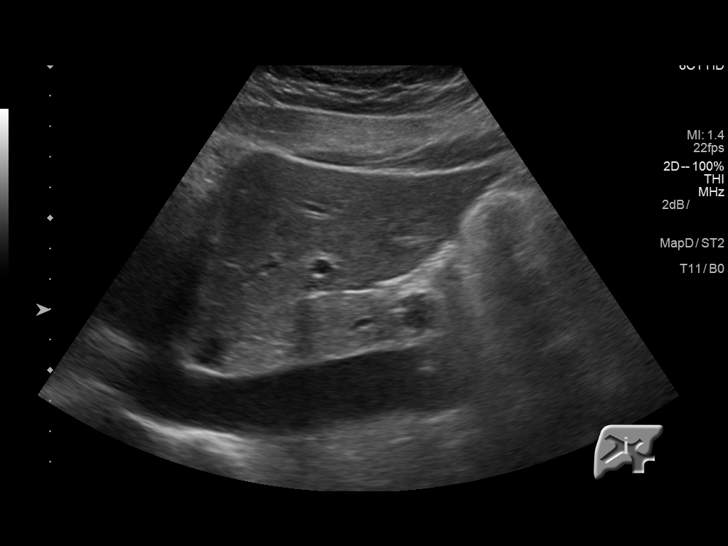
[im 19/41]
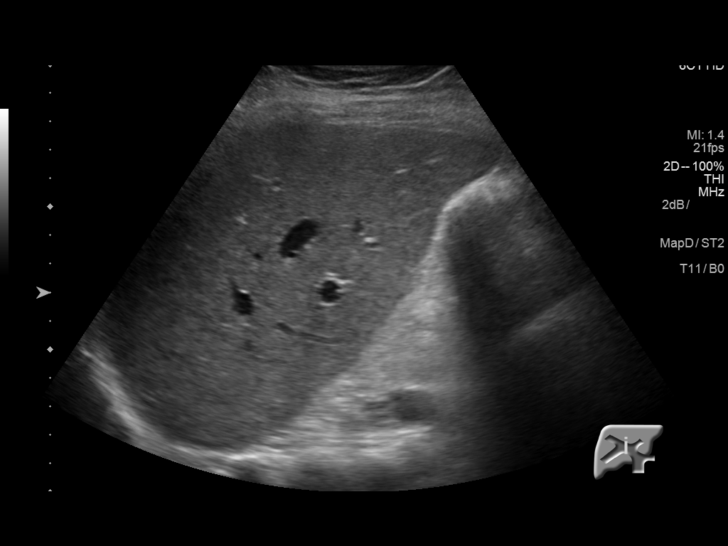
[im 22/41]
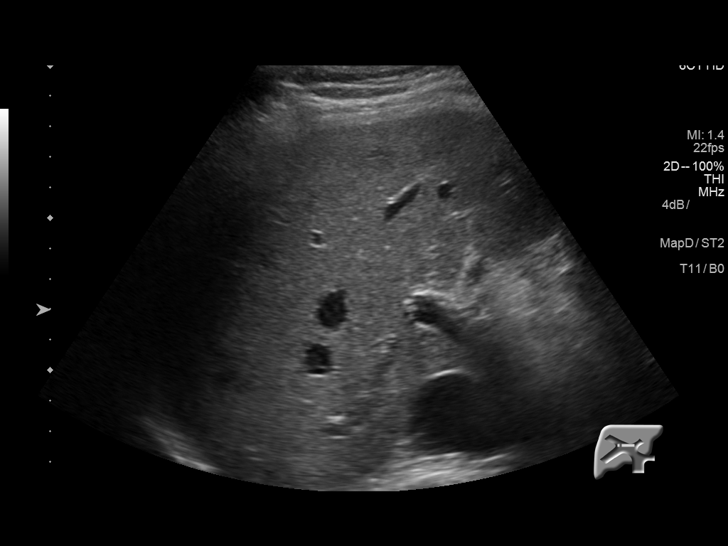
[im 26/41]
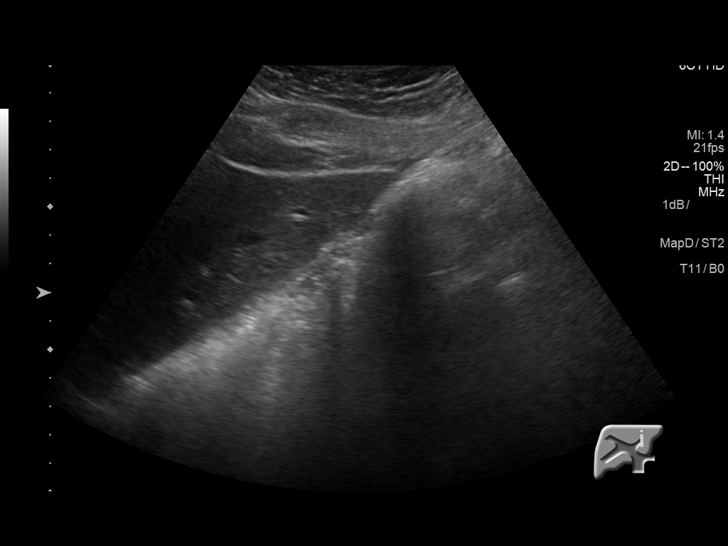
[im 27/41]
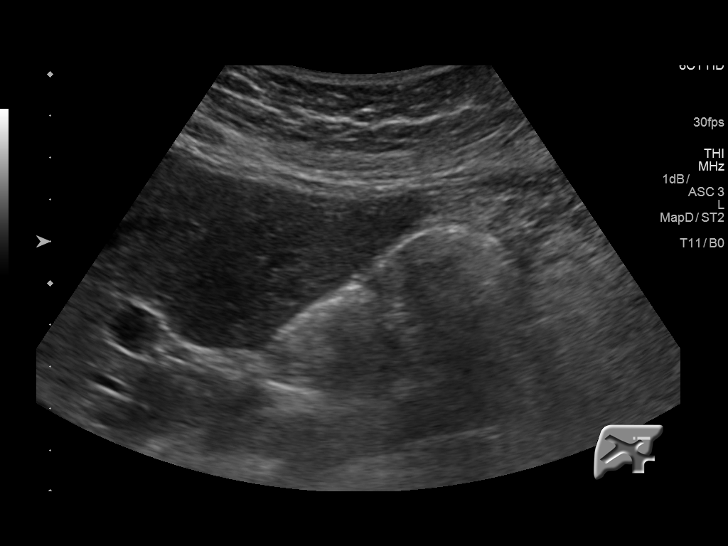
[im 31/41]
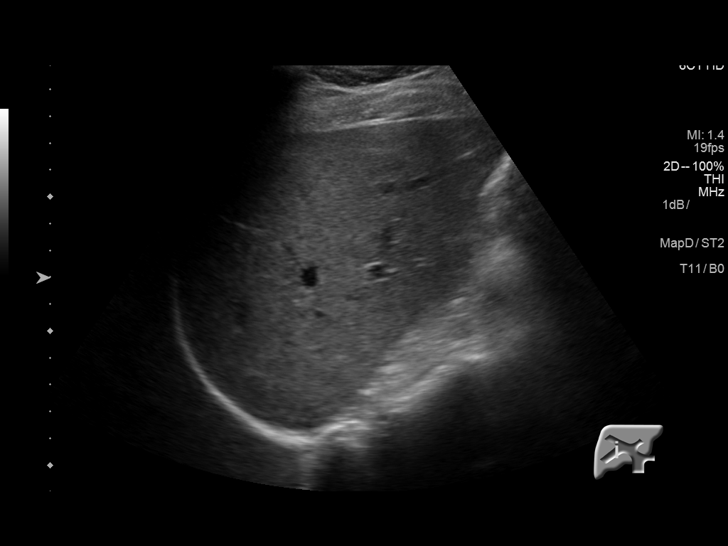
[im 34/41]
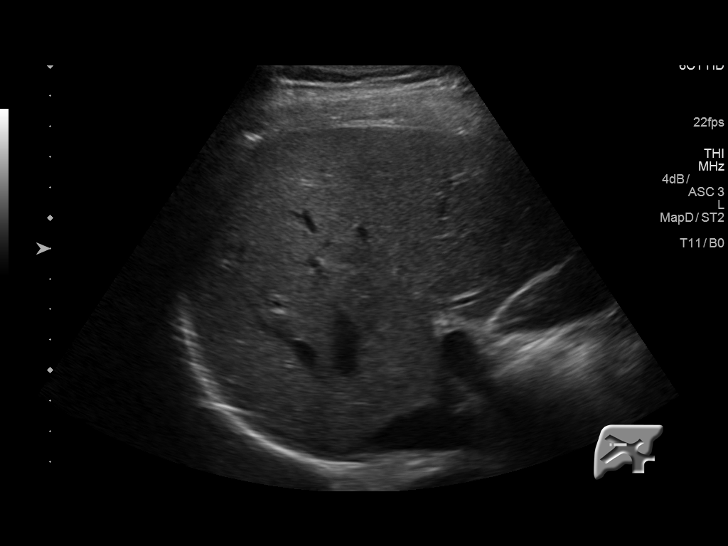
[im 37/41]
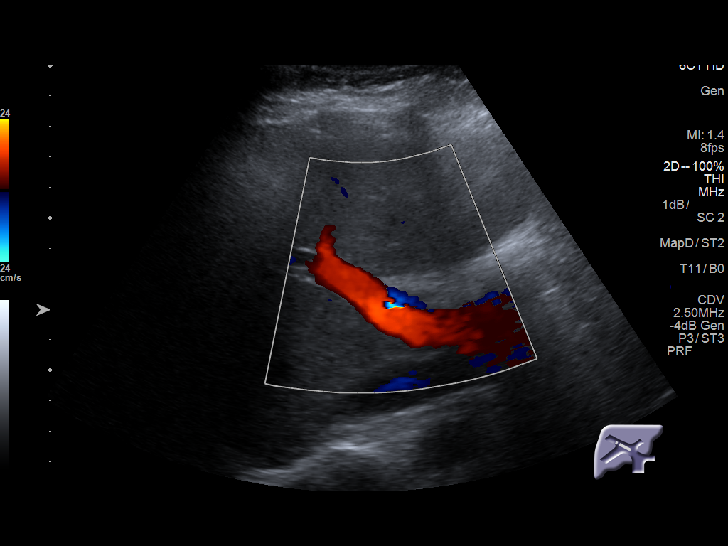
[im 41/41]
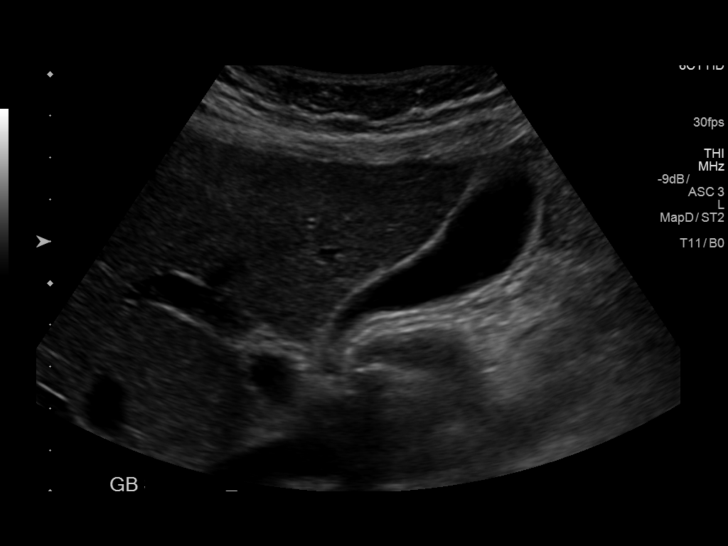

[14 of 25 positions shown; findings below may reference images not displayed]

FINDINGS: Gallbladder:

No gallstones or wall thickening visualized. No sonographic Murphy
sign noted by sonographer.

Common bile duct:

Diameter: Normal at 2.7 mm

Liver:

No focal lesion identified. Within normal limits in parenchymal
echogenicity.
IMPRESSION: Normal right upper quadrant ultrasound exam.

## 2019-12-30 NOTE — L&D Delivery Note (Signed)
Delivery Note  First Stage: Labor onset: Induction started 12/2 at 0605 with Cytotec. Foley balloon placed then Pitocin augmentation and AROM.  Analgesia /Anesthesia intrapartum: epidural AROM at 1235 clear fluid.   Second Stage: Complete dilation at 1355 Onset of pushing at 1357 FHR second stage: 130bpm  Delivery of a viable female infant on 11/29/20 at 1358 by CNM delivery of fetal head in LOA position with restitution to LOT. No nuchal cord;  Anterior then posterior shoulders delivered easily with gentle downward traction. Baby placed on mom's chest, and attended to by peds.  Cord double clamped after cessation of pulsation, cut by FOB   Third Stage: Placenta delivered spontaneously intact with 3VC @ 1403 Placenta disposition: routine disposal Uterine tone Firm / bleeding small  1st deg perineal laceration identified  Anesthesia for repair: epidural Repair 3-0 Vicryl SH x 1 Est. Blood Loss (mL):  Complications: precipitous  Mom to postpartum.  Baby to Couplet care / Skin to Skin.  Newborn: Birth Weight: 8#10  Apgar Scores: 8/9 Feeding planned: breast

## 2020-05-11 LAB — OB RESULTS CONSOLE VARICELLA ZOSTER ANTIBODY, IGG: Varicella: IMMUNE

## 2020-05-11 LAB — OB RESULTS CONSOLE HIV ANTIBODY (ROUTINE TESTING): HIV: NONREACTIVE

## 2020-05-11 LAB — OB RESULTS CONSOLE RPR: RPR: NONREACTIVE

## 2020-05-11 LAB — OB RESULTS CONSOLE HEPATITIS B SURFACE ANTIGEN: Hepatitis B Surface Ag: NEGATIVE

## 2020-05-11 LAB — OB RESULTS CONSOLE RUBELLA ANTIBODY, IGM: Rubella: IMMUNE

## 2020-09-05 ENCOUNTER — Other Ambulatory Visit: Payer: Self-pay

## 2020-09-05 ENCOUNTER — Observation Stay
Admission: EM | Admit: 2020-09-05 | Discharge: 2020-09-05 | Disposition: A | Discharge status: Home / Self Care | Payer: 59 | Attending: Obstetrics and Gynecology | Admitting: Obstetrics and Gynecology

## 2020-09-05 DIAGNOSIS — Z3A27 27 weeks gestation of pregnancy: Secondary | ICD-10-CM | POA: Insufficient documentation

## 2020-09-05 DIAGNOSIS — N8110 Cystocele, unspecified: Secondary | ICD-10-CM | POA: Diagnosis not present

## 2020-09-05 DIAGNOSIS — O99891 Other specified diseases and conditions complicating pregnancy: Secondary | ICD-10-CM | POA: Diagnosis not present

## 2020-09-05 DIAGNOSIS — O24419 Gestational diabetes mellitus in pregnancy, unspecified control: Secondary | ICD-10-CM | POA: Diagnosis not present

## 2020-09-05 LAB — WET PREP, GENITAL
Clue Cells Wet Prep HPF POC: NONE SEEN
Sperm: NONE SEEN
Trich, Wet Prep: NONE SEEN
Yeast Wet Prep HPF POC: NONE SEEN

## 2020-09-05 MED ORDER — TERBUTALINE SULFATE 1 MG/ML IJ SOLN
INTRAMUSCULAR | Status: AC
Start: 2020-09-05 — End: 2020-09-05
  Administered 2020-09-05: 0.25 mg via SUBCUTANEOUS
  Filled 2020-09-05: qty 1

## 2020-09-05 MED ORDER — TERBUTALINE SULFATE 1 MG/ML IJ SOLN: 0.2500 mg | Freq: Once | INTRAMUSCULAR | Status: AC

## 2020-09-05 NOTE — Progress Notes (Signed)
Pt to be d/c home and to self care. Pt given teaching on when to seek medical attention (LOF, VB and decreased FM, etc..).  Pt informed about what to expect with a cystocele and provided information documents. Pt verbalized understanding of d/c instructions.

## 2020-09-05 NOTE — Progress Notes (Signed)
Patient ID: Samantha Contreras, female   DOB: 09-28-1991, 29 y.o.   MRN: [id number]  Samantha Contreras is a 29 y.o. female. She is at [redacted]w[redacted]d gestation. Patient's last menstrual period was 02/23/2020. Estimated Date of Delivery: 12/05/20  Prenatal care site: [facility]  Chief complaint:pt c/o of tissue falling out of vagina for the last day . No LOF , no ctx  S: Resting comfortably. no CTX, no VB.no LOF,  Active fetal movement.   Maternal Medical History:   Past Medical History:  Diagnosis Date  . Gestational diabetes   . Medical history non-contributory     Past Surgical History:  Procedure Laterality Date  . COLONOSCOPY  2014    No Known Allergies  Prior to Admission medications   Medication Sig Start Date End Date Taking? Authorizing Provider  Prenatal Vit-Fe Fumarate-FA (MULTIVITAMIN-PRENATAL) 27-0.8 MG TABS tablet Take 1 tablet by mouth daily at 12 noon.   Yes [provider]  docusate sodium (COLACE) 100 MG capsule Take 1 capsule (100 mg total) by mouth daily as needed for mild constipation. Patient not taking: Reported on 09/05/2020 01/24/17   Christeen Douglas, MD  IRON PO Take 1 tablet by mouth daily. Patient not taking: Reported on 09/05/2020    [provider]     Social History: She  reports that she has never smoked. She has never used smokeless tobacco. She reports that she does not drink alcohol and does not use drugs.  Family History: family history includes Diabetes in her father and maternal grandfather.  no history of gyn cancers  Review of Systems: A full review of systems was performed and negative except as noted in the HPI.     O:  BP (!) 142/81 (BP Location: Right Arm)   Pulse 97   Temp 98.3 F (36.8 C) (Oral)   Resp 16   LMP 02/23/2020  No results found for this or any previous visit (from the past 48 hour(s)).   Constitutional: NAD, AAOx3  HE/ENT: extraocular movements grossly intact, moist mucous membranes CV: RRR PULM: nl  respiratory effort, CTABL     Abd: gravid, non-tender, non-distended, soft      Ext: Non-tender, Nonedmeatous   Psych: mood appropriate, speech normal Pelvic grade 2-3 cystocele noted , no rectocele,  cx closed / 30%/ OOP  NST: REactive Baseline: 145 Variability: moderate Accelerations present x >2, 10x10 Decelerations absent  Ctx q  Time  Sq terbutaline given 0.25 mg at 1830   at 1940 : no CTX . Wet mount negative   Assessment: 29 y.o. [redacted]w[redacted]d here for prolapsing tissue from vagina , c/w cystocele  preterm ctx  Principle diagnosis:   Plan: D/c home  precautions   pt reassured of findings  ----- Beverly Gust MD Attending Obstetrician and Gynecologist [facility], Department of OB/GYN [facility]

## 2020-09-05 NOTE — Discharge Summary (Signed)
Patient ID: Samantha Contreras, female   DOB: 1991/11/29, 29 y.o.   MRN: [id number]  Subjective [] Expand by Default  Samantha Contreras is a 29 y.o. female. She is at [redacted]w[redacted]d gestation. Patient's last menstrual period was 02/23/2020. Estimated Date of Delivery: 12/05/20  Prenatal care site: [facility]  Chief complaint:pt c/o of tissue falling out of vagina for the last day . No LOF , no ctx  S: Resting comfortably. noCTX, no VB.no LOF, Active fetal movement.   Maternal Medical History:       Past Medical History:  Diagnosis Date  . Gestational diabetes   . Medical history non-contributory          Past Surgical History:  Procedure Laterality Date  . COLONOSCOPY  2014    No Known Allergies         Prior to Admission medications   Medication Sig Start Date End Date Taking? Authorizing Provider  Prenatal Vit-Fe Fumarate-FA (MULTIVITAMIN-PRENATAL) 27-0.8 MG TABS tablet Take 1 tablet by mouth daily at 12 noon.   Yes [provider]  docusate sodium (COLACE) 100 MG capsule Take 1 capsule (100 mg total) by mouth daily as needed for mild constipation. Patient not taking: Reported on 09/05/2020 01/24/17   01/26/17, MD  IRON PO Take 1 tablet by mouth daily. Patient not taking: Reported on 09/05/2020    [provider]     Social History: She  reports that she has never smoked. She has never used smokeless tobacco. She reports that she does not drink alcohol and does not use drugs.  Family History: family history includes Diabetes in her father and maternal grandfather.  no history of gyn cancers  Review of Systems: A full review of systems was performed and negative except as noted in the HPI.     O:   Objective   BP (!) 142/81 (BP Location: Right Arm)   Pulse 97   Temp 98.3 F (36.8 C) (Oral)   Resp 16   LMP 02/23/2020  No results found for this or any previous visit (from the past 48 hour(s)).   Constitutional: NAD,  AAOx3  HE/ENT: extraocular movements grossly intact, moist mucous membranes CV: RRR PULM: nl respiratory effort, CTABL                                         Abd: gravid, non-tender, non-distended, soft                                                  Ext: Non-tender, Nonedmeatous                     Psych: mood appropriate, speech normal Pelvic grade 2-3 cystocele noted , no rectocele,  cx closed / 30%/ OOP  NST: REactive Baseline: 145 Variability: moderate Accelerations present x >2, 10x10 Decelerations absent  Ctx q 02/25/2020  Time  Sq terbutaline given 0.25 mg at 1830   at 1940 : no CTX . Wet mount negative   Assessment: 29 y.o. [redacted]w[redacted]d here for prolapsing tissue from vagina , c/w cystocele  preterm ctx  Principle diagnosis:   Plan: D/c home  precautions   pt reassured of findings  ----- [redacted]w[redacted]d MD Attending Obstetrician and  Gynecologist [facility], Department of OB/GYN [facility]         Electronically signed by Feliberto Gottron, Ihor Austin, MD at 09/05/2020 7:43 PM

## 2020-11-09 LAB — OB RESULTS CONSOLE GBS: GBS: NEGATIVE

## 2020-11-20 ENCOUNTER — Other Ambulatory Visit: Payer: Self-pay | Admitting: Obstetrics and Gynecology

## 2020-11-20 NOTE — Progress Notes (Signed)
Dating: EDD: 12/05/20 LMP: 02/23/20 and c/w [place] at 6+2 wks.   Preg c/b: 1. A1GDM 2. Mild polyhydramnios, AFI 23.6cm   Prenatal Labs: Blood type/Rh A Pos  Antibody screen neg  Rubella Immune  Varicella Immune  RPR NR  HBsAg Neg  HIV NR  GC neg  Chlamydia neg  Genetic screening Negative MaterniT21  1 hour GTT 160  3 hour GTT 84, 213, 170, 83  GBS negative   Tdap and flu 10/26/20 Breastfeeding Contraception TBD

## 2020-11-29 ENCOUNTER — Other Ambulatory Visit: Payer: Self-pay

## 2020-11-29 ENCOUNTER — Inpatient Hospital Stay: Payer: 59 | Admitting: Anesthesiology

## 2020-11-29 ENCOUNTER — Inpatient Hospital Stay
Admission: EM | Admit: 2020-11-29 | Discharge: 2020-11-30 | DRG: 807 | Disposition: A | Discharge status: Home / Self Care | Payer: 59 | Attending: Obstetrics and Gynecology | Admitting: Obstetrics and Gynecology

## 2020-11-29 ENCOUNTER — Encounter: Payer: Self-pay | Admitting: Obstetrics and Gynecology

## 2020-11-29 DIAGNOSIS — O24420 Gestational diabetes mellitus in childbirth, diet controlled: Secondary | ICD-10-CM | POA: Diagnosis present

## 2020-11-29 DIAGNOSIS — Z3A39 39 weeks gestation of pregnancy: Secondary | ICD-10-CM | POA: Diagnosis not present

## 2020-11-29 DIAGNOSIS — O403XX0 Polyhydramnios, third trimester, not applicable or unspecified: Principal | ICD-10-CM | POA: Diagnosis present

## 2020-11-29 LAB — GLUCOSE, CAPILLARY: Glucose-Capillary: 81 mg/dL (ref 70–99)

## 2020-11-29 LAB — CBC
HCT: 32.4 % — ABNORMAL LOW (ref 36.0–46.0)
Hemoglobin: 11.6 g/dL — ABNORMAL LOW (ref 12.0–15.0)
MCH: 32.4 pg (ref 26.0–34.0)
MCHC: 35.8 g/dL (ref 30.0–36.0)
MCV: 90.5 fL (ref 80.0–100.0)
Platelets: 198 10*3/uL (ref 150–400)
RBC: 3.58 MIL/uL — ABNORMAL LOW (ref 3.87–5.11)
RDW: 12.6 % (ref 11.5–15.5)
WBC: 7.8 10*3/uL (ref 4.0–10.5)
nRBC: 0 % (ref 0.0–0.2)

## 2020-11-29 LAB — RPR: RPR Ser Ql: NONREACTIVE

## 2020-11-29 LAB — TYPE AND SCREEN
ABO/RH(D): A POS
Antibody Screen: NEGATIVE

## 2020-11-29 MED ORDER — LIDOCAINE-EPINEPHRINE (PF) 1.5 %-1:200000 IJ SOLN
INTRAMUSCULAR | Status: DC | PRN
  Administered 2020-11-29: 3 mL via EPIDURAL

## 2020-11-29 MED ORDER — LIDOCAINE HCL (PF) 1 % IJ SOLN
INTRAMUSCULAR | Status: DC | PRN
  Administered 2020-11-29: 3 mL

## 2020-11-29 MED ORDER — FENTANYL CITRATE (PF) 100 MCG/2ML IJ SOLN: 50.0000 ug | INTRAMUSCULAR | Status: DC | PRN

## 2020-11-29 MED ORDER — OXYTOCIN 10 UNIT/ML IJ SOLN
INTRAMUSCULAR | Status: AC
  Filled 2020-11-29: qty 2

## 2020-11-29 MED ORDER — MISOPROSTOL 25 MCG QUARTER TABLET
25.0000 ug | ORAL_TABLET | ORAL | Status: DC | PRN
  Administered 2020-11-29: 25 ug via BUCCAL
  Filled 2020-11-29: qty 1

## 2020-11-29 MED ORDER — SOD CITRATE-CITRIC ACID 500-334 MG/5ML PO SOLN: 30.0000 mL | ORAL | Status: DC | PRN

## 2020-11-29 MED ORDER — PHENYLEPHRINE 40 MCG/ML (10ML) SYRINGE FOR IV PUSH (FOR BLOOD PRESSURE SUPPORT): 80.0000 ug | PREFILLED_SYRINGE | INTRAVENOUS | Status: DC | PRN

## 2020-11-29 MED ORDER — SENNOSIDES-DOCUSATE SODIUM 8.6-50 MG PO TABS
2.0000 | ORAL_TABLET | ORAL | Status: DC
  Administered 2020-11-30: 2 via ORAL
  Filled 2020-11-29: qty 2

## 2020-11-29 MED ORDER — EPHEDRINE 5 MG/ML INJ: 10.0000 mg | INTRAVENOUS | Status: DC | PRN

## 2020-11-29 MED ORDER — SIMETHICONE 80 MG PO CHEW: 80.0000 mg | CHEWABLE_TABLET | ORAL | Status: DC | PRN

## 2020-11-29 MED ORDER — COCONUT OIL OIL
1.0000 "application " | TOPICAL_OIL | Status: DC | PRN
  Administered 2020-11-29: 1 via TOPICAL
  Filled 2020-11-29: qty 120

## 2020-11-29 MED ORDER — PRENATAL MULTIVITAMIN CH
1.0000 | ORAL_TABLET | Freq: Every day | ORAL | Status: DC
  Administered 2020-11-30: 1 via ORAL
  Filled 2020-11-29: qty 1

## 2020-11-29 MED ORDER — LACTATED RINGERS IV SOLN: 500.0000 mL | INTRAVENOUS | Status: DC | PRN

## 2020-11-29 MED ORDER — DIPHENHYDRAMINE HCL 50 MG/ML IJ SOLN: 12.5000 mg | INTRAMUSCULAR | Status: DC | PRN

## 2020-11-29 MED ORDER — FENTANYL 2.5 MCG/ML W/ROPIVACAINE 0.15% IN NS 100 ML EPIDURAL (ARMC)
12.0000 mL/h | EPIDURAL | Status: DC
  Administered 2020-11-29: 12 mL/h via EPIDURAL

## 2020-11-29 MED ORDER — ACETAMINOPHEN 325 MG PO TABS: 650.0000 mg | ORAL_TABLET | ORAL | Status: DC | PRN

## 2020-11-29 MED ORDER — IBUPROFEN 600 MG PO TABS
600.0000 mg | ORAL_TABLET | Freq: Four times a day (QID) | ORAL | Status: DC
  Administered 2020-11-29 – 2020-11-30 (×4): 600 mg via ORAL
  Filled 2020-11-29 (×4): qty 1

## 2020-11-29 MED ORDER — LIDOCAINE HCL (PF) 1 % IJ SOLN
30.0000 mL | INTRAMUSCULAR | Status: DC | PRN
  Filled 2020-11-29: qty 30

## 2020-11-29 MED ORDER — FENTANYL 2.5 MCG/ML W/ROPIVACAINE 0.15% IN NS 100 ML EPIDURAL (ARMC)
EPIDURAL | Status: AC
  Filled 2020-11-29: qty 100

## 2020-11-29 MED ORDER — ACETAMINOPHEN 325 MG PO TABS
650.0000 mg | ORAL_TABLET | ORAL | Status: DC | PRN
  Administered 2020-11-29: 650 mg via ORAL
  Filled 2020-11-29: qty 2

## 2020-11-29 MED ORDER — LACTATED RINGERS IV SOLN
500.0000 mL | Freq: Once | INTRAVENOUS | Status: AC
  Administered 2020-11-29: 500 mL via INTRAVENOUS

## 2020-11-29 MED ORDER — ONDANSETRON HCL 4 MG PO TABS: 4.0000 mg | ORAL_TABLET | ORAL | Status: DC | PRN

## 2020-11-29 MED ORDER — MISOPROSTOL 25 MCG QUARTER TABLET
25.0000 ug | ORAL_TABLET | ORAL | Status: DC | PRN
  Filled 2020-11-29: qty 1

## 2020-11-29 MED ORDER — OXYTOCIN-SODIUM CHLORIDE 30-0.9 UT/500ML-% IV SOLN
1.0000 m[IU]/min | INTRAVENOUS | Status: DC
  Administered 2020-11-29: 2 m[IU]/min via INTRAVENOUS
  Filled 2020-11-29: qty 500

## 2020-11-29 MED ORDER — SODIUM CHLORIDE 0.9 % IV SOLN
INTRAVENOUS | Status: DC | PRN
  Administered 2020-11-29: 8 mL via EPIDURAL

## 2020-11-29 MED ORDER — WITCH HAZEL-GLYCERIN EX PADS
1.0000 "application " | MEDICATED_PAD | CUTANEOUS | Status: DC | PRN
  Administered 2020-11-29: 1 via TOPICAL
  Filled 2020-11-29: qty 100

## 2020-11-29 MED ORDER — TERBUTALINE SULFATE 1 MG/ML IJ SOLN: 0.2500 mg | Freq: Once | INTRAMUSCULAR | Status: DC | PRN

## 2020-11-29 MED ORDER — BENZOCAINE-MENTHOL 20-0.5 % EX AERO: 1.0000 "application " | INHALATION_SPRAY | CUTANEOUS | Status: DC | PRN

## 2020-11-29 MED ORDER — LACTATED RINGERS IV SOLN: INTRAVENOUS | Status: DC

## 2020-11-29 MED ORDER — ONDANSETRON HCL 4 MG/2ML IJ SOLN: 4.0000 mg | Freq: Four times a day (QID) | INTRAMUSCULAR | Status: DC | PRN

## 2020-11-29 MED ORDER — AMMONIA AROMATIC IN INHA
RESPIRATORY_TRACT | Status: AC
  Filled 2020-11-29: qty 10

## 2020-11-29 MED ORDER — OXYTOCIN-SODIUM CHLORIDE 30-0.9 UT/500ML-% IV SOLN
2.5000 [IU]/h | INTRAVENOUS | Status: DC
  Administered 2020-11-29: 2.5 [IU]/h via INTRAVENOUS
  Filled 2020-11-29: qty 500

## 2020-11-29 MED ORDER — ONDANSETRON HCL 4 MG/2ML IJ SOLN: 4.0000 mg | INTRAMUSCULAR | Status: DC | PRN

## 2020-11-29 MED ORDER — OXYTOCIN BOLUS FROM INFUSION
333.0000 mL | Freq: Once | INTRAVENOUS | Status: AC
  Administered 2020-11-29: 333 mL via INTRAVENOUS

## 2020-11-29 MED ORDER — DIBUCAINE (PERIANAL) 1 % EX OINT
1.0000 "application " | TOPICAL_OINTMENT | CUTANEOUS | Status: DC | PRN
  Administered 2020-11-29: 1 via RECTAL
  Filled 2020-11-29: qty 28

## 2020-11-29 MED ORDER — DIPHENHYDRAMINE HCL 25 MG PO CAPS: 25.0000 mg | ORAL_CAPSULE | Freq: Four times a day (QID) | ORAL | Status: DC | PRN

## 2020-11-29 MED ORDER — MISOPROSTOL 200 MCG PO TABS
ORAL_TABLET | ORAL | Status: AC
  Administered 2020-11-29: 25 ug via VAGINAL
  Filled 2020-11-29: qty 4

## 2020-11-29 MED ORDER — ZOLPIDEM TARTRATE 5 MG PO TABS: 5.0000 mg | ORAL_TABLET | Freq: Every evening | ORAL | Status: DC | PRN

## 2020-11-29 NOTE — Progress Notes (Signed)
Labor Progress Note  Samantha Contreras is a 29 y.o. G3P1001 at [redacted]w[redacted]d by ultrasound admitted for induction of labor due to Gestational diabetes and polyhydramnios.  Subjective: comfortable after epidural.   Objective: BP 128/72   Pulse 98   Temp 98.1 F (36.7 C) (Oral)   Resp 16   Ht 5\' 4"  (1.626 m)   Wt 80.7 kg   LMP 02/23/2020   SpO2 99%   BMI 30.55 kg/m  Notable VS details: reviewed.   Fetal Assessment: FHT:  FHR: 140 bpm, variability: moderate,  accelerations:  Present,  decelerations:  Absent Category/reactivity:  Category I UC:   regular, every 1.5-3 minutes, Pitocin at 40mu/min SVE:   4/70/-1, soft/midposition.  AROM performed, large amount clear fluid noted.  Membrane status: AROM at 1235 Amniotic color:  clear  Labs: Lab Results  Component Value Date   WBC 7.8 11/29/2020   HGB 11.6 (L) 11/29/2020   HCT 32.4 (L) 11/29/2020   MCV 90.5 11/29/2020   PLT 198 11/29/2020    Assessment / Plan: IOL at 39.1wks, polyhydramnios and GDMA1.   Labor: progressing well on Pitocin after FB and cytotec x 1.  Preeclampsia:  no e.o Pre-e Fetal Wellbeing:  Category I Pain Control:  Epidural I/D:  n/a Anticipated MOD:  NSVD  14/01/2020 Hever Castilleja, CNM 11/29/2020, 1:11 PM

## 2020-11-29 NOTE — Anesthesia Procedure Notes (Signed)
Epidural Patient location during procedure: OB Start time: 11/29/2020 12:09 PM End time: 11/29/2020 12:25 PM  Staffing Anesthesiologist: Corinda Gubler, MD Performed: anesthesiologist   Preanesthetic Checklist Completed: patient identified, IV checked, site marked, risks and benefits discussed, surgical consent, monitors and equipment checked, pre-op evaluation and timeout performed  Epidural Patient position: sitting Prep: ChloraPrep Patient monitoring: heart rate, continuous pulse ox and blood pressure Approach: midline Location: L3-L4 Injection technique: LOR saline  Needle:  Needle type: Tuohy  Needle gauge: 17 G Needle length: 9 cm and 9 Needle insertion depth: 4 cm Catheter type: closed end flexible Catheter size: 19 Gauge Catheter at skin depth: 9 cm Test dose: negative and 1.5% lidocaine with Epi 1:200 K  Assessment Sensory level: T10 Events: blood not aspirated, injection not painful, no injection resistance, no paresthesia and negative IV test  Additional Notes first attempt Pt. Evaluated and documentation done after procedure finished. Patient identified. Risks/Benefits/Options discussed with patient including but not limited to bleeding, infection, nerve damage, paralysis, failed block, incomplete pain control, headache, blood pressure changes, nausea, vomiting, reactions to medication both or allergic, itching and postpartum back pain. Confirmed with bedside nurse the patient's most recent platelet count. Confirmed with patient that they are not currently taking any anticoagulation, have any bleeding history or any family history of bleeding disorders. Patient expressed understanding and wished to proceed. All questions were answered. Sterile technique was used throughout the entire procedure. Please see nursing notes for vital signs. Test dose was given through epidural catheter and negative prior to continuing to dose epidural or start infusion. Warning signs of high  block given to the patient including shortness of breath, tingling/numbness in hands, complete motor block, or any concerning symptoms with instructions to call for help. Patient was given instructions on fall risk and not to get out of bed. All questions and concerns addressed with instructions to call with any issues or inadequate analgesia.   Patient tolerated the insertion well without immediate complications.Reason for block:procedure for pain

## 2020-11-29 NOTE — Discharge Summary (Signed)
Obstetrical Discharge Summary  Patient Name: Samantha Contreras DOB: August 22, 1991 MRN: [id number]  Date of Admission: 11/29/2020 Date of Delivery: 11/29/20 Delivered by: Heloise Ochoa CNM Date of Discharge: 11/30/2020  Primary OB: [facility]   YHC:WCBJSEG'B last menstrual period was 02/23/2020. EDC Estimated Date of Delivery: 12/05/20 Gestational Age at Delivery: [redacted]w[redacted]d   Antepartum complications:  1. A1GDM 2. Mild polyhydramnios, AFI 23.6cm  Admitting Diagnosis: IOL due to Poly, GDMA1 Secondary Diagnosis: SVD, 1st deg perineal  Patient Active Problem List   Diagnosis Date Noted  . Polyhydramnios affecting pregnancy in third trimester 11/29/2020  . Vaginal prolapse 09/05/2020  . Indication for care in labor and delivery, delivered 01/23/2017  . Indication for care in labor and delivery, antepartum 01/22/2017  . Indication for care in labor or delivery 01/22/2017    Augmentation: AROM, Pitocin, Cytotec and IP Foley Complications: None Intrapartum complications/course: see delivery notes Date of Delivery: 11/29/20 Delivered By: Heloise Ochoa CNM Delivery Type: spontaneous vaginal delivery Anesthesia: epidural Placenta: spontaneous Laceration: 1st deg perineal Episiotomy: none Newborn Data: Live born female Birth Weight: 8 lb 10.3 oz (3920 g) APGAR: 8, 9  Newborn Delivery   Birth date/time: 11/29/2020 13:58:00 Delivery type: Vaginal, Spontaneous     Postpartum Procedures: None Edinburgh:  Edinburgh Postnatal Depression Scale Screening Tool 11/29/2020  I have been able to laugh and see the funny side of things. 0  I have looked forward with enjoyment to things. 0  I have blamed myself unnecessarily when things went wrong. 1  I have been anxious or worried for no good reason. 1  I have felt scared or panicky for no good reason. 0  Things have been getting on top of me. 1  I have been so unhappy that I have had difficulty sleeping. 0  I have felt sad or miserable. 0   I have been so unhappy that I have been crying. 0  The thought of harming myself has occurred to me. 0  Edinburgh Postnatal Depression Scale Total 3      Post partum course:  Patient had an uncomplicated postpartum course.  By time of discharge on PPD#1, her pain was controlled on oral pain medications; she had appropriate lochia and was ambulating, voiding without difficulty and tolerating regular diet.  She was deemed stable for discharge to home.     Discharge Physical Exam:  BP 115/81 (BP Location: Right Arm)   Pulse 86   Temp 98.2 F (36.8 C) (Oral)   Resp 18   Ht 5\' 4"  (1.626 m)   Wt 80.7 kg   LMP 02/23/2020   SpO2 97%   Breastfeeding Unknown   BMI 30.55 kg/m   General: NAD CV: RRR Pulm: CTABL, nl effort ABD: s/nd/nt, fundus firm and below the umbilicus Lochia: moderate Perineum: well approximated/intact DVT Evaluation: LE non-ttp, no evidence of DVT on exam.  Hemoglobin  Date Value Ref Range Status  11/30/2020 11.5 (L) 12.0 - 15.0 g/dL Final   HCT  Date Value Ref Range Status  11/30/2020 33.6 (L) 36 - 46 % Final     Disposition: stable, discharge to home. Baby Feeding: breastmilk Baby Disposition: home with mom  Rh Immune globulin given: n/a Rubella vaccine given: immune Varicella vaccine given: immune Tdap vaccine given in AP or PP setting: 10/26/20 Flu vaccine given in AP or PP setting: 10/26/20  Contraception: TBD  Prenatal Labs:  Blood type/Rh  A pos  Antibody screen neg  Rubella Immune  Varicella Immune  RPR NR  HBsAg Neg  HIV NR  GC neg  Chlamydia neg  Genetic screening Negative MaterniT21  1 hour GTT  160  3 hour GTT  [id number]  GBS  Negative      Plan:  Ariba Lehnen was discharged to home in good condition. Follow-up appointment with delivering provider in 6 weeks.  Discharge Medications: Allergies as of 11/30/2020   No Known Allergies     Medication List    STOP taking these medications   docusate sodium 100 MG  capsule Commonly known as: Colace   IRON PO     TAKE these medications   acetaminophen 325 MG tablet Commonly known as: Tylenol Take 2 tablets (650 mg total) by mouth every 4 (four) hours as needed for mild pain or moderate pain (for pain scale < 4).   ibuprofen 600 MG tablet Commonly known as: ADVIL Take 1 tablet (600 mg total) by mouth every 6 (six) hours as needed for mild pain or moderate pain.   multivitamin-prenatal 27-0.8 MG Tabs tablet Take 1 tablet by mouth daily at 12 noon.        Follow-up Information    McVey, Prudencio Pair, CNM. Schedule an appointment as soon as possible for a visit in 6 week(s).   Specialty: Obstetrics and Gynecology Why: Please call to schedule 6 week postpartum visit Contact information: [address] [place] [address] [phone]               Signed:  Gustavo Lah, CNM 11/30/2020  2:34 PM  Margaretmary Eddy, CNM Certified Nurse Midwife [facility]

## 2020-11-29 NOTE — Anesthesia Preprocedure Evaluation (Addendum)
Anesthesia Evaluation  Patient identified by MRN, date of birth, ID band Patient awake    Reviewed: Allergy & Precautions, NPO status , Patient's Chart, lab work & pertinent test results  History of Anesthesia Complications Negative for: history of anesthetic complications  Airway Mallampati: II  TM Distance: >3 FB Neck ROM: Full    Dental no notable dental hx. (+) Teeth Intact   Pulmonary neg pulmonary ROS, neg sleep apnea, neg COPD, Patient abstained from smoking.Not current smoker,    Pulmonary exam normal breath sounds clear to auscultation       Cardiovascular Exercise Tolerance: Good METS(-) hypertension(-) CAD and (-) Past MI negative cardio ROS  (-) dysrhythmias  Rhythm:Regular Rate:Normal - Systolic murmurs    Neuro/Psych negative neurological ROS  negative psych ROS   GI/Hepatic neg GERD  ,(+)     (-) substance abuse  ,   Endo/Other  diabetes, Gestational  Renal/GU negative Renal ROS     Musculoskeletal   Abdominal   Peds  Hematology   Anesthesia Other Findings Past Medical History: No date: Gestational diabetes No date: Medical history non-contributory  Reproductive/Obstetrics (+) Pregnancy                             Anesthesia Physical Anesthesia Plan  ASA: II  Anesthesia Plan: Epidural   Post-op Pain Management:    Induction:   PONV Risk Score and Plan: 2 and Treatment may vary due to age or medical condition and Ondansetron  Airway Management Planned: Natural Airway  Additional Equipment:   Intra-op Plan:   Post-operative Plan:   Informed Consent: I have reviewed the patients History and Physical, chart, labs and discussed the procedure including the risks, benefits and alternatives for the proposed anesthesia with the patient or authorized representative who has indicated his/her understanding and acceptance.       Plan Discussed with:  Surgeon  Anesthesia Plan Comments: (Discussed R/B/A of neuraxial anesthesia technique with patient: - rare risks of spinal/epidural hematoma, nerve damage, infection - Risk of PDPH - Risk of itching - Risk of nausea and vomiting - Risk of poor block necessitating replacement of epidural. Patient voiced understanding.)        Anesthesia Quick Evaluation

## 2020-11-29 NOTE — OB Triage Note (Signed)
Pt is G3 P1 being induced for excess fluid. Pt was last checked on 11/29 at [facility] and was 1 cm.

## 2020-11-29 NOTE — Lactation Note (Signed)
This note was copied from a baby's chart. Lactation Consultation Note  Patient Name: Samantha Contreras POEUM'P Date: 11/29/2020 Reason for consult: Initial assessment;Term;Other (Comment) (IOL)  Lactation called to [facility] to assist with first feeding post delivery. This is mom's second baby, and she had to use NS with first due to bilateral flat nipples. Upon examination, and attempts to latch LC determined a nipple shield would be beneficial- mom sized for size 37mm nipple shield; baby accepted easily. After a few minutes baby spontaneously began sucking with strong rhythmic sucking pattern.  Reviewed with parents newborn BF basics: newborn stomach size, feeding patterns and behaviors, early feeding cues, output expectations, and benefits of skin to skin. Encourage 8 or more attempts in first 24 hours, and re-taught hand expression and encouraged throughout the day/night as baby may transition to more sleepy period.  Mom is confident with positioning and latching baby, and well versed in use of nipple shield d/t prior experience. Mom also has experience pumping, and bottle feeding at times as well. LC name/number written on whiteboard in LDR[facility] and parents encouraged to call for support or with questions/concerns.  Maternal Data Formula Feeding for Exclusion: No Has patient been taught Hand Expression?: Yes Does the patient have breastfeeding experience prior to this delivery?: Yes  Feeding Feeding Type: Breast Fed  LATCH Score Latch: Repeated attempts needed to sustain latch, nipple held in mouth throughout feeding, stimulation needed to elicit sucking reflex.  Audible Swallowing: None  Type of Nipple: Flat (used 24 shield)  Comfort (Breast/Nipple): Soft / non-tender  Hold (Positioning): Assistance needed to correctly position infant at breast and maintain latch.  LATCH Score: 5  Interventions Interventions: Breast feeding basics reviewed;Assisted with latch;Skin to skin;Hand  express;Adjust position;Support pillows;Shells (nipple shield)  Lactation Tools Discussed/Used Tools: Nipple Shields Nipple shield size: 24   Consult Status Consult Status: Follow-up Date: 11/29/20 Follow-up type: In-patient    Danford Bad 11/29/2020, 2:55 PM

## 2020-11-29 NOTE — H&P (Signed)
OB History & Physical   History of Present Illness:  Chief Complaint: induction of labor due to too much fluid.   HPI:  Samantha Contreras is a 29 y.o. G81P1001 female at [redacted]w[redacted]d dated by [place] at 29+2wks.  She presents to L&D for IOL due to Poly and GDMA1. Reports active FM, irreg UCs since cytotec placed this am. Ate breakfast around 4.30am prior to arrival, but slightly hungry.      Pregnancy Issues: 1. A1GDM 2. Mild polyhydramnios, AFI 23.6cm   Maternal Medical History:   Past Medical History:  Diagnosis Date  . Gestational diabetes   . Medical history non-contributory     Past Surgical History:  Procedure Laterality Date  . COLONOSCOPY  2014    No Known Allergies  Prior to Admission medications   Medication Sig Start Date End Date Taking? Authorizing Provider  Prenatal Vit-Fe Fumarate-FA (MULTIVITAMIN-PRENATAL) 27-0.8 MG TABS tablet Take 1 tablet by mouth daily at 12 noon.   Yes [provider]  docusate sodium (COLACE) 100 MG capsule Take 1 capsule (100 mg total) by mouth daily as needed for mild constipation. Patient not taking: Reported on 09/05/2020 01/24/17   Christeen Douglas, MD  IRON PO Take 1 tablet by mouth daily. Patient not taking: Reported on 09/05/2020    [provider]     Prenatal care site: [facility]  Social History: She  reports that she has never smoked. She has never used smokeless tobacco. She reports that she does not drink alcohol and does not use drugs.  Family History: family history includes Diabetes in her father and maternal grandfather.   Review of Systems: A full review of systems was performed and negative except as noted in the HPI.     Physical Exam:  Vital Signs: BP 120/78 (BP Location: Left Arm)   Pulse 93   Temp 98.2 F (36.8 C) (Oral)   Resp 16   Ht 5\' 4"  (1.626 m)   Wt 80.7 kg   LMP 02/23/2020   BMI 30.55 kg/m  General: no acute distress.  HEENT: normocephalic, atraumatic Heart: regular rate & rhythm.   No murmurs/rubs/gallops Lungs: clear to auscultation bilaterally, normal respiratory effort Abdomen: soft, gravid, non-tender;  EFW: 7.5lbs Pelvic: cx soft, middleposition. Foley balloon placed, pt tolerated well.   External: Normal external female genitalia  Cervix: Dilation: 1.5 / Effacement (%): 60 / Station: -2    Extremities: non-tender, symmetric, no edema bilaterally.  DTRs: 2+  Neurologic: Alert & oriented x 3.    Results for orders placed or performed during the hospital encounter of 11/29/20 (from the past 24 hour(s))  CBC     Status: Abnormal   Collection Time: 11/29/20  5:42 AM  Result Value Ref Range   WBC 7.8 4.0 - 10.5 K/uL   RBC 3.58 (L) 3.87 - 5.11 MIL/uL   Hemoglobin 11.6 (L) 12.0 - 15.0 g/dL   HCT 14/02/21 (L) 36 - 46 %   MCV 90.5 80.0 - 100.0 fL   MCH 32.4 26.0 - 34.0 pg   MCHC 35.8 30.0 - 36.0 g/dL   RDW 46.5 03.5 - 46.5 %   Platelets 198 150 - 400 K/uL   nRBC 0.0 0.0 - 0.2 %  Type and screen     Status: None   Collection Time: 11/29/20  5:42 AM  Result Value Ref Range   ABO/RH(D) A POS    Antibody Screen NEG    Sample Expiration      12/02/2020,2359 Performed  at [facility], [address], [place], [place] [address]     Pertinent Results:  Prenatal Labs: Blood type/Rh  A pos  Antibody screen neg  Rubella Immune  Varicella Immune  RPR NR  HBsAg Neg  HIV NR  GC neg  Chlamydia neg  Genetic screening Negative MaterniT21  1 hour GTT  160  3 hour GTT  [id number]  GBS  Negative   FHT: 140bpm, moderate variability, + accels, no decels.  TOCO: q2-75min,irregular.  SVE:  Dilation: 1.5 / Effacement (%): 60 / Station: -2    Cephalic by leopolds and SVE  No results found.  Assessment:  Samantha Contreras is a 29 y.o. G69P1001 female at [redacted]w[redacted]d with IOL due to poly and GDMA1.    Plan:  1. Admit to Labor & Delivery; consents reviewed and obtained - recent COVID 09/2020, all sx resolved. No indication for isolation or re-test.   2. Fetal Well  being  - Fetal Tracing: Cat I tracing - Group B Streptococcus ppx indicated: negative - Presentation: cephalic confirmed by Leopolds and exam   3. Routine OB: - Prenatal labs reviewed, as above - Rh A Pos  - CBC, T&S, RPR on admit - Clear fluids, IVF  4. Induction of Labor -  Contractions: external toco in place -  Pelvis proven to 7lbs -  Plan for induction with cytotec, foley balloon, Pitocin, AROM -  Plan for continuous fetal monitoring  -  Maternal pain control as desired; requesting regional anesthesia - Anticipate vaginal delivery  5. Post Partum Planning: - Infant feeding: breast - Contraception: TBD - Tdap and Flu 10/26/20  Prudencio Pair Scotty Weigelt, CNM 11/29/20 8:30 AM

## 2020-11-30 ENCOUNTER — Ambulatory Visit: Payer: Self-pay

## 2020-11-30 LAB — CBC
HCT: 33.6 % — ABNORMAL LOW (ref 36.0–46.0)
Hemoglobin: 11.5 g/dL — ABNORMAL LOW (ref 12.0–15.0)
MCH: 31.3 pg (ref 26.0–34.0)
MCHC: 34.2 g/dL (ref 30.0–36.0)
MCV: 91.6 fL (ref 80.0–100.0)
Platelets: 182 10*3/uL (ref 150–400)
RBC: 3.67 MIL/uL — ABNORMAL LOW (ref 3.87–5.11)
RDW: 12.9 % (ref 11.5–15.5)
WBC: 9.9 10*3/uL (ref 4.0–10.5)
nRBC: 0 % (ref 0.0–0.2)

## 2020-11-30 MED ORDER — ACETAMINOPHEN 325 MG PO TABS: 650.0000 mg | ORAL_TABLET | ORAL | Status: AC | PRN

## 2020-11-30 MED ORDER — IBUPROFEN 600 MG PO TABS: 600.0000 mg | ORAL_TABLET | Freq: Four times a day (QID) | ORAL | 1 refills | Status: AC | PRN

## 2020-11-30 NOTE — Discharge Instructions (Signed)
Postpartum Care After Vaginal Delivery This sheet gives you information about how to care for yourself from the time you deliver your baby to up to 6-12 weeks after delivery (postpartum period). Your health care provider may also give you more specific instructions. If you have problems or questions, contact your health care provider. Follow these instructions at home: Vaginal bleeding  It is normal to have vaginal bleeding (lochia) after delivery. Wear a sanitary pad for vaginal bleeding and discharge. ? During the first week after delivery, the amount and appearance of lochia is often similar to a menstrual period. ? Over the next few weeks, it will gradually decrease to a dry, yellow-brown discharge. ? For most women, lochia stops completely by 4-6 weeks after delivery. Vaginal bleeding can vary from woman to woman.  Change your sanitary pads frequently. Watch for any changes in your flow, such as: ? A sudden increase in volume. ? A change in color. ? Large blood clots.  If you pass a blood clot from your vagina, save it and call your health care provider to discuss. Do not flush blood clots down the toilet before talking with your health care provider.  Do not use tampons or douches until your health care provider says this is safe.  If you are not breastfeeding, your period should return 6-8 weeks after delivery. If you are feeding your child breast milk only (exclusive breastfeeding), your period may not return until you stop breastfeeding. Perineal care  Keep the area between the vagina and the anus (perineum) clean and dry as told by your health care provider. Use medicated pads and pain-relieving sprays and creams as directed.  If you had a cut in the perineum (episiotomy) or a tear in the vagina, check the area for signs of infection until you are healed. Check for: ? More redness, swelling, or pain. ? Fluid or blood coming from the cut or tear. ? Warmth. ? Pus or a bad  smell.  You may be given a squirt bottle to use instead of wiping to clean the perineum area after you go to the bathroom. As you start healing, you may use the squirt bottle before wiping yourself. Make sure to wipe gently.  To relieve pain caused by an episiotomy, a tear in the vagina, or swollen veins in the anus (hemorrhoids), try taking a warm sitz bath 2-3 times a day. A sitz bath is a warm water bath that is taken while you are sitting down. The water should only come up to your hips and should cover your buttocks. Breast care  Within the first few days after delivery, your breasts may feel heavy, full, and uncomfortable (breast engorgement). Milk may also leak from your breasts. Your health care provider can suggest ways to help relieve the discomfort. Breast engorgement should go away within a few days.  If you are breastfeeding: ? Wear a bra that supports your breasts and fits you well. ? Keep your nipples clean and dry. Apply creams and ointments as told by your health care provider. ? You may need to use breast pads to absorb milk that leaks from your breasts. ? You may have uterine contractions every time you breastfeed for up to several weeks after delivery. Uterine contractions help your uterus return to its normal size. ? If you have any problems with breastfeeding, work with your health care provider or lactation consultant.  If you are not breastfeeding: ? Avoid touching your breasts a lot. Doing this can make   your breasts produce more milk. ? Wear a good-fitting bra and use cold packs to help with swelling. ? Do not squeeze out (express) milk. This causes you to make more milk. Intimacy and sexuality  Ask your health care provider when you can engage in sexual activity. This may depend on: ? Your risk of infection. ? How fast you are healing. ? Your comfort and desire to engage in sexual activity.  You are able to get pregnant after delivery, even if you have not had  your period. If desired, talk with your health care provider about methods of birth control (contraception). Medicines  Take over-the-counter and prescription medicines only as told by your health care provider.  If you were prescribed an antibiotic medicine, take it as told by your health care provider. Do not stop taking the antibiotic even if you start to feel better. Activity  Gradually return to your normal activities as told by your health care provider. Ask your health care provider what activities are safe for you.  Rest as much as possible. Try to rest or take a nap while your baby is sleeping. Eating and drinking   Drink enough fluid to keep your urine pale yellow.  Eat high-fiber foods every day. These may help prevent or relieve constipation. High-fiber foods include: ? Whole grain cereals and breads. ? Brown rice. ? Beans. ? Fresh fruits and vegetables.  Do not try to lose weight quickly by cutting back on calories.  Take your prenatal vitamins until your postpartum checkup or until your health care provider tells you it is okay to stop. Lifestyle  Do not use any products that contain nicotine or tobacco, such as cigarettes and e-cigarettes. If you need help quitting, ask your health care provider.  Do not drink alcohol, especially if you are breastfeeding. General instructions  Keep all follow-up visits for you and your baby as told by your health care provider. Most women visit their health care provider for a postpartum checkup within the first 3-6 weeks after delivery. Contact a health care provider if:  You feel unable to cope with the changes that your child brings to your life, and these feelings do not go away.  You feel unusually sad or worried.  Your breasts become red, painful, or hard.  You have a fever.  You have trouble holding urine or keeping urine from leaking.  You have little or no interest in activities you used to enjoy.  You have not  breastfed at all and you have not had a menstrual period for 12 weeks after delivery.  You have stopped breastfeeding and you have not had a menstrual period for 12 weeks after you stopped breastfeeding.  You have questions about caring for yourself or your baby.  You pass a blood clot from your vagina. Get help right away if:  You have chest pain.  You have difficulty breathing.  You have sudden, severe leg pain.  You have severe pain or cramping in your lower abdomen.  You bleed from your vagina so much that you fill more than one sanitary pad in one hour. Bleeding should not be heavier than your heaviest period.  You develop a severe headache.  You faint.  You have blurred vision or spots in your vision.  You have bad-smelling vaginal discharge.  You have thoughts about hurting yourself or your baby. If you ever feel like you may hurt yourself or others, or have thoughts about taking your own life, get help  right away. You can go to the nearest emergency department or call:  Your local emergency services (911 in the [place]  A suicide crisis helpline, such as the National Suicide Prevention Lifeline at 1-800-273-8255. This is open 24 hours a day. Summary  The period of time right after you deliver your newborn up to 6-12 weeks after delivery is called the postpartum period.  Gradually return to your normal activities as told by your health care provider.  Keep all follow-up visits for you and your baby as told by your health care provider. This information is not intended to replace advice given to you by your health care provider. Make sure you discuss any questions you have with your health care provider. Document Revised: 12/18/2017 Document Reviewed: 09/28/2017 Elsevier Patient Education  2020 [employer]  Postpartum Baby Blues The postpartum period begins right after the birth of a baby. During this time, there is often a lot of joy and excitement. It is also a  time of many changes in the life of the parents. No matter how many times a mother gives birth, each child brings new challenges to the family, including different ways of relating to one another. It is common to have feelings of excitement along with confusing changes in moods, emotions, and thoughts. You may feel happy one minute and sad or stressed the next. These feelings of sadness usually happen in the period right after you have your baby, and they go away within a week or two. This is called the "baby blues." What are the causes? There is no known cause of baby blues. It is likely caused by a combination of factors. However, changes in hormone levels after childbirth are believed to trigger some of the symptoms. Other factors that can play a role in these mood changes include:  Lack of sleep.  Stressful life events, such as poverty, caring for a loved one, or death of a loved one.  Genetics. What are the signs or symptoms? Symptoms of this condition include:  Brief changes in mood, such as going from extreme happiness to sadness.  Decreased concentration.  Difficulty sleeping.  Crying spells and tearfulness.  Loss of appetite.  Irritability.  Anxiety. If the symptoms of baby blues last for more than 2 weeks or become more severe, you may have postpartum depression. How is this diagnosed? This condition is diagnosed based on an evaluation of your symptoms. There are no medical or lab tests that lead to a diagnosis, but there are various questionnaires that a health care provider may use to identify women with the baby blues or postpartum depression. How is this treated? Treatment is not needed for this condition. The baby blues usually go away on their own in 1-2 weeks. Social support is often all that is needed. You will be encouraged to get adequate sleep and rest. Follow these instructions at home: Lifestyle      Get as much rest as you can. Take a nap when the baby  sleeps.  Exercise regularly as told by your health care provider. Some women find yoga and walking to be helpful.  Eat a balanced and nourishing diet. This includes plenty of fruits and vegetables, whole grains, and lean proteins.  Do little things that you enjoy. Have a cup of tea, take a bubble bath, read your favorite magazine, or listen to your favorite music.  Avoid alcohol.  Ask for help with household chores, cooking, grocery shopping, or running errands. Do not try to   everything yourself. Consider hiring a postpartum doula to help. This is a professional who specializes in providing support to new mothers.  Try not to make any major life changes during pregnancy or right after giving birth. This can add stress. General instructions  Talk to people close to you about how you are feeling. Get support from your partner, family members, friends, or other new moms. You may want to join a support group.  Find ways to cope with stress. This may include: ? Writing your thoughts and feelings in a journal. ? Spending time outside. ? Spending time with people who make you laugh.  Try to stay positive in how you think. Think about the things you are grateful for.  Take over-the-counter and prescription medicines only as told by your health care provider.  Let your health care provider know if you have any concerns.  Keep all postpartum visits as told by your health care provider. This is important. Contact a health care provider if:  Your baby blues do not go away after 2 weeks. Get help right away if:  You have thoughts of taking your own life (suicidal thoughts).  You think you may harm the baby or other people.  You see or hear things that are not there (hallucinations). Summary  After giving birth, you may feel happy one minute and sad or stressed the next. Feelings of sadness that happen right after the baby is born and go away after a week or two are called the "baby  blues."  You can manage the baby blues by getting enough rest, eating a healthy diet, exercising, spending time with supportive people, and finding ways to cope with stress.  If feelings of sadness and stress last longer than 2 weeks or get in the way of caring for your baby, talk to your health care provider. This may mean you have postpartum depression. This information is not intended to replace advice given to you by your health care provider. Make sure you discuss any questions you have with your health care provider. Document Revised: 04/08/2019 Document Reviewed: 02/10/2017 Elsevier Patient Education  [place]. Breastfeeding Tips for a Good Latch Latching is how your baby's mouth attaches to your nipple to breastfeed. It is an important part of breastfeeding. Your baby may have trouble latching for a number of reasons. A poor latch may cause you to have cracked or sore nipples or other problems. Follow these instructions at home: How to position your baby  Find a comfortable place to sit or lie down. Your neck and back should be well supported.  If you are seated, place a pillow or rolled-up blanket under your baby. This will bring him or her to the level of your breast.  Make sure that your baby's belly (abdomen) is facing your belly.  Try different positions to find one that works best for you and your baby. How to help your baby latch   To start, gently rub your breast. Move your fingertips in a circle as you massage from your chest wall toward your nipple. This helps milk flow. Keep doing this during feeding if needed.  Position your breast. Hold your breast with four fingers underneath and your thumb above your nipple. Keep your fingers away from your nipple and your baby's mouth. Follow these steps to help your baby latch: 1. Rub your baby's lips gently with your finger or nipple. 2. When your baby's mouth is open wide enough, quickly bring your baby  to your breast  and place your whole nipple into your baby's mouth. Place as much of the colored area around your nipple (areola)as possible into your baby's mouth. 3. Your baby's tongue should be between his or her lower gum and your breast. 4. You should be able to see more areola above your baby's upper lip than below the lower lip. 5. When your baby starts sucking, you will feel a gentle pull on your nipple. You should not feel any pain. Be patient. It is common for a baby to suck for about 2-3 minutes to start the flow of breast milk. 6. Make sure that your baby's mouth is in the right position around your nipple. Your baby's lips should make a seal on your breast and be turned outward.  General instructions  Look for these signs that your baby has latched on to your nipple: ? The baby is quietly tugging or sucking without causing you pain. ? You hear the baby swallow after every 3 or 4 sucks. ? You see movement above and in front of the baby's ears while he or she is sucking.  Be aware of these signs that your baby has not latched on to your nipple: ? The baby makes sucking sounds or smacking sounds while feeding. ? You have nipple pain.  If your baby is not latched well, put your little finger between your baby's gums and your nipple. This will break the seal. Then try to help your baby latch again.  If you keep having problems, get help from a breastfeeding specialist (lactation consultant). Contact a doctor if:  You have cracking or soreness in your nipples that lasts longer than 1 week.  You have nipple pain.  Your breasts are filled with too much milk (engorgement), and this does not improve after 48-72 hours.  You have a plugged milk duct and a fever.  You follow the tips for a good latch but you keep having problems or concerns.  You have a pus-like fluid coming from your breast.  Your baby is not gaining weight.  Your baby loses weight. Summary  Latching is how your baby's mouth  attaches to your nipple to breastfeed.  Try different positions for breastfeeding to find one that works best for you and your baby.  A poor latch may cause you to have cracked or sore nipples or other problems. This information is not intended to replace advice given to you by your health care provider. Make sure you discuss any questions you have with your health care provider. Document Revised: 04/06/2019 Document Reviewed: 07/22/2017 Elsevier Patient Education  2020 [employer] Breastfeeding  Choosing to breastfeed is one of the best decisions you can make for yourself and your baby. A change in hormones during pregnancy causes your breasts to make breast milk in your milk-producing glands. Hormones prevent breast milk from being released before your baby is born. They also prompt milk flow after birth. Once breastfeeding has begun, thoughts of your baby, as well as his or her sucking or crying, can stimulate the release of milk from your milk-producing glands. Benefits of breastfeeding Research shows that breastfeeding offers many health benefits for infants and mothers. It also offers a cost-free and convenient way to feed your baby. For your baby  Your first milk (colostrum) helps your baby's digestive system to function better.  Special cells in your milk (antibodies) help your baby to fight off infections.  Breastfed babies are less likely to develop asthma, allergies,   obesity, or type 2 diabetes. They are also at lower risk for sudden infant death syndrome (SIDS).  Nutrients in breast milk are better able to meet your baby's needs compared to infant formula.  Breast milk improves your baby's brain development. For you  Breastfeeding helps to create a very special bond between you and your baby.  Breastfeeding is convenient. Breast milk costs nothing and is always available at the correct temperature.  Breastfeeding helps to burn calories. It helps you to lose the weight  that you gained during pregnancy.  Breastfeeding makes your uterus return faster to its size before pregnancy. It also slows bleeding (lochia) after you give birth.  Breastfeeding helps to lower your risk of developing type 2 diabetes, osteoporosis, rheumatoid arthritis, cardiovascular disease, and breast, ovarian, uterine, and endometrial cancer later in life. Breastfeeding basics Starting breastfeeding  Find a comfortable place to sit or lie down, with your neck and back well-supported.  Place a pillow or a rolled-up blanket under your baby to bring him or her to the level of your breast (if you are seated). Nursing pillows are specially designed to help support your arms and your baby while you breastfeed.  Make sure that your baby's tummy (abdomen) is facing your abdomen.  Gently massage your breast. With your fingertips, massage from the outer edges of your breast inward toward the nipple. This encourages milk flow. If your milk flows slowly, you may need to continue this action during the feeding.  Support your breast with 4 fingers underneath and your thumb above your nipple (make the letter "C" with your hand). Make sure your fingers are well away from your nipple and your baby's mouth.  Stroke your baby's lips gently with your finger or nipple.  When your baby's mouth is open wide enough, quickly bring your baby to your breast, placing your entire nipple and as much of the areola as possible into your baby's mouth. The areola is the colored area around your nipple. ? More areola should be visible above your baby's upper lip than below the lower lip. ? Your baby's lips should be opened and extended outward (flanged) to ensure an adequate, comfortable latch. ? Your baby's tongue should be between his or her lower gum and your breast.  Make sure that your baby's mouth is correctly positioned around your nipple (latched). Your baby's lips should create a seal on your breast and be  turned out (everted).  It is common for your baby to suck about 2-3 minutes in order to start the flow of breast milk. Latching Teaching your baby how to latch onto your breast properly is very important. An improper latch can cause nipple pain, decreased milk supply, and poor weight gain in your baby. Also, if your baby is not latched onto your nipple properly, he or she may swallow some air during feeding. This can make your baby fussy. Burping your baby when you switch breasts during the feeding can help to get rid of the air. However, teaching your baby to latch on properly is still the best way to prevent fussiness from swallowing air while breastfeeding. Signs that your baby has successfully latched onto your nipple  Silent tugging or silent sucking, without causing you pain. Infant's lips should be extended outward (flanged).  Swallowing heard between every 3-4 sucks once your milk has started to flow (after your let-down milk reflex occurs).  Muscle movement above and in front of his or her ears while sucking. Signs that your  baby has not successfully latched onto your nipple  Sucking sounds or smacking sounds from your baby while breastfeeding.  Nipple pain. If you think your baby has not latched on correctly, slip your finger into the corner of your baby's mouth to break the suction and place it between your baby's gums. Attempt to start breastfeeding again. Signs of successful breastfeeding Signs from your baby  Your baby will gradually decrease the number of sucks or will completely stop sucking.  Your baby will fall asleep.  Your baby's body will relax.  Your baby will retain a small amount of milk in his or her mouth.  Your baby will let go of your breast by himself or herself. Signs from you  Breasts that have increased in firmness, weight, and size 1-3 hours after feeding.  Breasts that are softer immediately after breastfeeding.  Increased milk volume, as well as  a change in milk consistency and color by the fifth day of breastfeeding.  Nipples that are not sore, cracked, or bleeding. Signs that your baby is getting enough milk  Wetting at least 1-2 diapers during the first 24 hours after birth.  Wetting at least 5-6 diapers every 24 hours for the first week after birth. The urine should be clear or pale yellow by the age of 5 days.  Wetting 6-8 diapers every 24 hours as your baby continues to grow and develop.  At least 3 stools in a 24-hour period by the age of 5 days. The stool should be soft and yellow.  At least 3 stools in a 24-hour period by the age of 7 days. The stool should be seedy and yellow.  No loss of weight greater than 10% of birth weight during the first 3 days of life.  Average weight gain of 4-7 oz (113-198 g) per week after the age of 4 days.  Consistent daily weight gain by the age of 5 days, without weight loss after the age of 2 weeks. After a feeding, your baby may spit up a small amount of milk. This is normal. Breastfeeding frequency and duration Frequent feeding will help you make more milk and can prevent sore nipples and extremely full breasts (breast engorgement). Breastfeed when you feel the need to reduce the fullness of your breasts or when your baby shows signs of hunger. This is called "breastfeeding on demand." Signs that your baby is hungry include:  Increased alertness, activity, or restlessness.  Movement of the head from side to side.  Opening of the mouth when the corner of the mouth or cheek is stroked (rooting).  Increased sucking sounds, smacking lips, cooing, sighing, or squeaking.  Hand-to-mouth movements and sucking on fingers or hands.  Fussing or crying. Avoid introducing a pacifier to your baby in the first 4-6 weeks after your baby is born. After this time, you may choose to use a pacifier. Research has shown that pacifier use during the first year of a baby's life decreases the risk of  sudden infant death syndrome (SIDS). Allow your baby to feed on each breast as long as he or she wants. When your baby unlatches or falls asleep while feeding from the first breast, offer the second breast. Because newborns are often sleepy in the first few weeks of life, you may need to awaken your baby to get him or her to feed. Breastfeeding times will vary from baby to baby. However, the following rules can serve as a guide to help you make sure that your   baby is properly fed:  Newborns (babies 4 weeks of age or younger) may breastfeed every 1-3 hours.  Newborns should not go without breastfeeding for longer than 3 hours during the day or 5 hours during the night.  You should breastfeed your baby a minimum of 8 times in a 24-hour period. Breast milk pumping     Pumping and storing breast milk allows you to make sure that your baby is exclusively fed your breast milk, even at times when you are unable to breastfeed. This is especially important if you go back to work while you are still breastfeeding, or if you are not able to be present during feedings. Your lactation consultant can help you find a method of pumping that works best for you and give you guidelines about how long it is safe to store breast milk. Caring for your breasts while you breastfeed Nipples can become dry, cracked, and sore while breastfeeding. The following recommendations can help keep your breasts moisturized and healthy:  Avoid using soap on your nipples.  Wear a supportive bra designed especially for nursing. Avoid wearing underwire-style bras or extremely tight bras (sports bras).  Air-dry your nipples for 3-4 minutes after each feeding.  Use only cotton bra pads to absorb leaked breast milk. Leaking of breast milk between feedings is normal.  Use lanolin on your nipples after breastfeeding. Lanolin helps to maintain your skin's normal moisture barrier. Pure lanolin is not harmful (not toxic) to your baby. You  may also hand express a few drops of breast milk and gently massage that milk into your nipples and allow the milk to air-dry. In the first few weeks after giving birth, some women experience breast engorgement. Engorgement can make your breasts feel heavy, warm, and tender to the touch. Engorgement peaks within 3-5 days after you give birth. The following recommendations can help to ease engorgement:  Completely empty your breasts while breastfeeding or pumping. You may want to start by applying warm, moist heat (in the shower or with warm, water-soaked hand towels) just before feeding or pumping. This increases circulation and helps the milk flow. If your baby does not completely empty your breasts while breastfeeding, pump any extra milk after he or she is finished.  Apply ice packs to your breasts immediately after breastfeeding or pumping, unless this is too uncomfortable for you. To do this: ? Put ice in a plastic bag. ? Place a towel between your skin and the bag. ? Leave the ice on for 20 minutes, 2-3 times a day.  Make sure that your baby is latched on and positioned properly while breastfeeding. If engorgement persists after 48 hours of following these recommendations, contact your health care provider or a lactation consultant. Overall health care recommendations while breastfeeding  Eat 3 healthy meals and 3 snacks every day. Well-nourished mothers who are breastfeeding need an additional 450-500 calories a day. You can meet this requirement by increasing the amount of a balanced diet that you eat.  Drink enough water to keep your urine pale yellow or clear.  Rest often, relax, and continue to take your prenatal vitamins to prevent fatigue, stress, and low vitamin and mineral levels in your body (nutrient deficiencies).  Do not use any products that contain nicotine or tobacco, such as cigarettes and e-cigarettes. Your baby may be harmed by chemicals from cigarettes that pass into  breast milk and exposure to secondhand smoke. If you need help quitting, ask your health care provider.  Avoid alcohol.    Do not use illegal drugs or marijuana.  Talk with your health care provider before taking any medicines. These include over-the-counter and prescription medicines as well as vitamins and herbal supplements. Some medicines that may be harmful to your baby can pass through breast milk.  It is possible to become pregnant while breastfeeding. If birth control is desired, ask your health care provider about options that will be safe while breastfeeding your baby. Where to find more information: [employer]: www.llli.org Contact a health care provider if:  You feel like you want to stop breastfeeding or have become frustrated with breastfeeding.  Your nipples are cracked or bleeding.  Your breasts are red, tender, or warm.  You have: ? Painful breasts or nipples. ? A swollen area on either breast. ? A fever or chills. ? Nausea or vomiting. ? Drainage other than breast milk from your nipples.  Your breasts do not become full before feedings by the fifth day after you give birth.  You feel sad and depressed.  Your baby is: ? Too sleepy to eat well. ? Having trouble sleeping. ? More than 42 week old and wetting fewer than 6 diapers in a 24-hour period. ? Not gaining weight by 55 days of age.  Your baby has fewer than 3 stools in a 24-hour period.  Your baby's skin or the white parts of his or her eyes become yellow. Get help right away if:  Your baby is overly tired (lethargic) and does not want to wake up and feed.  Your baby develops an unexplained fever. Summary  Breastfeeding offers many health benefits for infant and mothers.  Try to breastfeed your infant when he or she shows early signs of hunger.  Gently tickle or stroke your baby's lips with your finger or nipple to allow the baby to open his or her mouth. Bring the baby to your  breast. Make sure that much of the areola is in your baby's mouth. Offer one side and burp the baby before you offer the other side.  Talk with your health care provider or lactation consultant if you have questions or you face problems as you breastfeed. This information is not intended to replace advice given to you by your health care provider. Make sure you discuss any questions you have with your health care provider. Document Revised: 03/11/2018 Document Reviewed: 01/16/2017 Elsevier Patient Education  2020 [employer] Breast Pumping Tips Breast pumping is a way to get milk out of your breasts. You will then store the milk for your baby to use when you are away from home. There are three ways to pump. You can:  Use your hand to massage and squeeze your breast (hand expression).  Use a hand-held machine to manually pump your milk.  Use an electric machine to pump your milk. In the beginning you may not get much milk. After a few days your breasts should make more. Pumping can help you start making milk after your baby is born. Pumping helps you to keep making milk when you are away from your baby. When should I pump? You can start pumping soon after your baby is born. Follow these tips:  When you are with your baby: ? Pump after you breastfeed. ? Pump from the free breast while you breastfeed.  When you are away from your baby: ? Pump every 2-3 hours for 15 minutes. ? Pump both breasts at the same time if you can.  If your baby drinks formula,  pump around the time your baby gets the formula.  If you drank alcohol, wait 2 hours before you pump.  If you are going to have surgery, ask your doctor when you should pump again. How do I get ready to pump? Take steps to relax. Try these things to help your milk come in:  Smell your baby's blanket or clothes.  Look at a picture or video of your baby.  Sit in a quiet, private space.  Massage your breast and nipple.  Place a  cloth on your breast. The cloth should be warm and a little wet.  Play relaxing music.  Picture your milk flowing. What are some tips? General tips for pumping breast milk   Always wash your hands before pumping.  If you do not get much milk or if pumping hurts, try different pump settings or a different kind of pump.  Drink enough fluid so your pee (urine) is clear or pale yellow.  Wear clothing that opens in the front or is easy to take off.  Pump milk into a clean bottle or container.  Do not use anything that has nicotine or tobacco. Examples are cigarettes and e-cigarettes. If you need help quitting, ask your doctor. Tips for storing breast milk   Store breast milk in a clean, BPA-free container. These include: ? A glass or plastic bottle. ? A milk storage bag.  Store only 2-4 ounces of breast milk in each container.  Swirl the breast milk in the container. Do not shake it.  Write down the date you pumped the milk on the container.  This is how long you can store breast milk: ? Room temperature: 6-8 hours. It is best to use the milk within 4 hours. ? Cooler with ice packs: 24 hours. ? Refrigerator: 5-8 days, if the milk is clean. It is best to use the milk within 3 days. ? Freezer: 9-12 months, if the milk is clean and stored away from the freezer door. It is best to use the milk within 6 months.  Put milk in the back of the refrigerator or freezer.  Thaw frozen milk using warm water. Do not use the microwave. Tips for choosing a breast pump When choosing a pump, keep the following things in mind:  Manual breast pumps do not need electricity. They cost less. They can be hard to use.  Electric breast pumps use electricity. They are more expensive. They are easier to use. They collect more milk.  The suction cup (flange) should be the right size.  Before you buy the pump, check if your insurance will pay for it. Tips for caring for a breast pump  Check the  manual that came with your pump for cleaning tips.  Clean the pump after you use it. To do this: ? Wipe down the electrical part. Use a dry cloth or paper towel. Do not put this part in water or in cleaning products. ? Wash the plastic parts with soap and warm water. Or use the dishwasher if the manual says it is safe. You do not need to clean the tubing unless it touched breast milk. ? Let all the parts air dry. Avoid drying them with a cloth or towel. ? When the parts are clean and dry, put the pump back together. Then store the pump.  If there is water in the tubing when you want to pump: 1. Attach the tubing to the pump. 2. Turn on the pump. 3. Turn off the  pump when the tube is dry.  Try not to touch the inside of pump parts. Summary  Pumping can help you start making milk after your baby is born. It lets you keep making milk when you are away from your baby.  When you are away from your baby, pump for about 15 minutes every 2-3 hours. Pump both breasts at the same time, if you can. This information is not intended to replace advice given to you by your health care provider. Make sure you discuss any questions you have with your health care provider. Document Revised: 04/06/2019 Document Reviewed: 01/19/2017 Elsevier Patient Education  2020 [employer].

## 2020-11-30 NOTE — Progress Notes (Signed)
Post Partum Day 1  Subjective: no complaints, up ad lib, voiding, tolerating PO and + flatus  Doing well, no concerns. Ambulating without difficulty, pain managed with PO meds, tolerating regular diet, and voiding without difficulty.   No fever/chills, chest pain, shortness of breath, nausea/vomiting, or leg pain. No nipple or breast pain. No headache, visual changes, or RUQ/epigastric pain.  Objective: BP 115/81 (BP Location: Right Arm)   Pulse 86   Temp 98.2 F (36.8 C) (Oral)   Resp 18   Ht 5\' 4"  (1.626 m)   Wt 80.7 kg   LMP 02/23/2020   SpO2 97%   Breastfeeding Unknown   BMI 30.55 kg/m    Physical Exam:  General: alert, cooperative and no distress Breasts: soft/nontender CV: RRR Pulm: nl effort, CTABL Abdomen: soft, non-tender, active bowel sounds Uterine Fundus: firm Perineum: minimal edema Lochia: appropriate DVT Evaluation: No evidence of DVT seen on physical exam.  Recent Labs    11/29/20 0542 11/30/20 0626  HGB 11.6* 11.5*  HCT 32.4* 33.6*  WBC 7.8 9.9  PLT 198 182    Assessment/Plan: 29 y.o. G3P2002 postpartum day # 1  -Continue routine postpartum care -Lactation consult PRN for breastfeeding -Discussed contraceptive options including implant, IUDs hormonal and non-hormonal, injection, pills/ring/patch, condoms, and NFP.  -Hemodynamically stable postpartum, asymptomatic  -Immunization status: all immunizations up to date  Disposition: Desires discharge home today if baby is also able to go home today   LOS: 1 day   14/03/21, Gustavo Lah 11/30/2020, 9:15 AM   ----- 14/02/2020  Certified Nurse Midwife [facility]

## 2020-11-30 NOTE — Lactation Note (Addendum)
This note was copied from a baby's chart. Mom used a  Nipple shield feeding first baby and was able to wean off the shield, she is needing to use a shield with this child also, requests another 24 mm nipple shield to take home, I gave her a 24 mm shield, palmolive dish soap to clean the shields with and a pink denture cup to store the shields in.  I did not observe a feeding as baby had recently fed and baby is sleeping and will have 24 hr testing done.  She states the baby is doing well with the shield, given instructions on weaning off the shield, encouraged contacting lactation if she has diffculties doing this.

## 2020-11-30 NOTE — Anesthesia Postprocedure Evaluation (Signed)
Anesthesia Post Note  Patient: Samantha Contreras  Procedure(s) Performed: AN AD HOC LABOR EPIDURAL  Patient location during evaluation: Mother Baby Anesthesia Type: Epidural Level of consciousness: awake and alert Pain management: pain level controlled Vital Signs Assessment: post-procedure vital signs reviewed and stable Respiratory status: spontaneous breathing, nonlabored ventilation and respiratory function stable Cardiovascular status: stable Postop Assessment: no headache, no backache and epidural receding Anesthetic complications: no   No complications documented.   Last Vitals:  Vitals:   11/30/20 0322 11/30/20 0735  BP: 100/68 115/81  Pulse: 75 86  Resp: 18 18  Temp: 36.8 C 36.8 C  SpO2: 96% 97%    Last Pain:  Vitals:   11/30/20 0735  TempSrc: Oral  PainSc:                  Rosanne Gutting

## 2020-11-30 NOTE — Progress Notes (Signed)
Pt discharged with infant. Discharge instructions, prescriptions, and follow up appointments given to and reviewed with patient. Pt verbalized understanding. To be escorted out by auxillary.  °

## 2021-11-18 ENCOUNTER — Ambulatory Visit: Payer: Self-pay | Admitting: Dermatology

## 2022-06-27 DIAGNOSIS — L910 Hypertrophic scar: Secondary | ICD-10-CM | POA: Diagnosis not present

## 2022-06-27 DIAGNOSIS — D225 Melanocytic nevi of trunk: Secondary | ICD-10-CM | POA: Diagnosis not present

## 2022-06-27 DIAGNOSIS — Z872 Personal history of diseases of the skin and subcutaneous tissue: Secondary | ICD-10-CM | POA: Diagnosis not present

## 2022-06-27 DIAGNOSIS — D2262 Melanocytic nevi of left upper limb, including shoulder: Secondary | ICD-10-CM | POA: Diagnosis not present

## 2022-06-27 DIAGNOSIS — D2261 Melanocytic nevi of right upper limb, including shoulder: Secondary | ICD-10-CM | POA: Diagnosis not present

## 2022-06-27 DIAGNOSIS — D485 Neoplasm of uncertain behavior of skin: Secondary | ICD-10-CM | POA: Diagnosis not present

## 2022-06-27 DIAGNOSIS — D2272 Melanocytic nevi of left lower limb, including hip: Secondary | ICD-10-CM | POA: Diagnosis not present

## 2022-07-25 DIAGNOSIS — Z01419 Encounter for gynecological examination (general) (routine) without abnormal findings: Secondary | ICD-10-CM | POA: Diagnosis not present

## 2022-09-12 DIAGNOSIS — L905 Scar conditions and fibrosis of skin: Secondary | ICD-10-CM | POA: Diagnosis not present

## 2022-09-12 DIAGNOSIS — D225 Melanocytic nevi of trunk: Secondary | ICD-10-CM | POA: Diagnosis not present

## 2023-04-06 DIAGNOSIS — J020 Streptococcal pharyngitis: Secondary | ICD-10-CM | POA: Diagnosis not present

## 2023-04-06 DIAGNOSIS — J029 Acute pharyngitis, unspecified: Secondary | ICD-10-CM | POA: Diagnosis not present

## 2023-04-06 DIAGNOSIS — Z6824 Body mass index (BMI) 24.0-24.9, adult: Secondary | ICD-10-CM | POA: Diagnosis not present

## 2023-07-10 DIAGNOSIS — L821 Other seborrheic keratosis: Secondary | ICD-10-CM | POA: Diagnosis not present

## 2023-07-10 DIAGNOSIS — L218 Other seborrheic dermatitis: Secondary | ICD-10-CM | POA: Diagnosis not present

## 2023-07-10 DIAGNOSIS — D225 Melanocytic nevi of trunk: Secondary | ICD-10-CM | POA: Diagnosis not present

## 2023-07-10 DIAGNOSIS — Z09 Encounter for follow-up examination after completed treatment for conditions other than malignant neoplasm: Secondary | ICD-10-CM | POA: Diagnosis not present

## 2023-07-10 DIAGNOSIS — Z872 Personal history of diseases of the skin and subcutaneous tissue: Secondary | ICD-10-CM | POA: Diagnosis not present

## 2023-07-10 DIAGNOSIS — L910 Hypertrophic scar: Secondary | ICD-10-CM | POA: Diagnosis not present

## 2023-10-30 DIAGNOSIS — H6983 Other specified disorders of Eustachian tube, bilateral: Secondary | ICD-10-CM | POA: Diagnosis not present
